# Patient Record
Sex: Male | Born: 1975
Health system: Southern US, Community
[De-identification: ages and names within clinical notes are randomized; demographics above are authoritative.]

## PROBLEM LIST (undated history)

## (undated) DIAGNOSIS — F411 Generalized anxiety disorder: Secondary | ICD-10-CM

## (undated) DIAGNOSIS — J45909 Unspecified asthma, uncomplicated: Secondary | ICD-10-CM

## (undated) DIAGNOSIS — D72819 Decreased white blood cell count, unspecified: Secondary | ICD-10-CM

## (undated) DIAGNOSIS — D696 Thrombocytopenia, unspecified: Secondary | ICD-10-CM

## (undated) HISTORY — DX: Generalized anxiety disorder: F41.1

## (undated) HISTORY — DX: Unspecified asthma, uncomplicated: J45.909

## (undated) HISTORY — PX: WISDOM TOOTH EXTRACTION: SHX21

## (undated) HISTORY — DX: Thrombocytopenia, unspecified: D69.6

## (undated) HISTORY — DX: Decreased white blood cell count, unspecified: D72.819

## (undated) HISTORY — PX: TONSILLECTOMY: SHX5217

---

## 2007-11-07 ENCOUNTER — Ambulatory Visit: Payer: Self-pay | Admitting: Internal Medicine

## 2007-12-19 ENCOUNTER — Ambulatory Visit: Payer: Self-pay | Admitting: Internal Medicine

## 2007-12-19 DIAGNOSIS — E785 Hyperlipidemia, unspecified: Secondary | ICD-10-CM | POA: Insufficient documentation

## 2007-12-19 LAB — CONVERTED CEMR LAB
AST: 23 units/L (ref 0–37)
Bilirubin, Direct: 0.2 mg/dL (ref 0.0–0.3)
CRP, High Sensitivity: <1 — ABNORMAL LOW (ref 0.00–5.00)
Chloride: 104 meq/L (ref 96–112)
Cholesterol: 144 mg/dL (ref 0–200)
Creatinine, Ser: 1 mg/dL (ref 0.4–1.5)
Eosinophils Absolute: 0.1 10*3/uL (ref 0.0–0.6)
Eosinophils Relative: 2.5 % (ref 0.0–5.0)
GFR calc non Af Amer: 93 mL/min
Glucose, Bld: 84 mg/dL (ref 70–99)
HCT: 45.7 % (ref 39.0–52.0)
Ketones, ur: NEGATIVE mg/dL
Leukocytes, UA: NEGATIVE
MCV: 90 fL (ref 78.0–100.0)
Neutrophils Relative %: 60.8 % (ref 43.0–77.0)
Nitrite: NEGATIVE
RBC: 5.08 M/uL (ref 4.22–5.81)
Sodium: 139 meq/L (ref 135–145)
Total Bilirubin: 1.4 mg/dL — ABNORMAL HIGH (ref 0.3–1.2)
Total Protein: 7.1 g/dL (ref 6.0–8.3)
Urobilinogen, UA: 0.2 (ref 0.0–1.0)
WBC: 4 10*3/uL — ABNORMAL LOW (ref 4.5–10.5)

## 2007-12-23 ENCOUNTER — Encounter: Payer: Self-pay | Admitting: Internal Medicine

## 2008-06-22 ENCOUNTER — Telehealth: Payer: Self-pay | Admitting: Internal Medicine

## 2008-07-01 ENCOUNTER — Encounter: Payer: Self-pay | Admitting: Internal Medicine

## 2008-12-08 ENCOUNTER — Ambulatory Visit: Payer: Self-pay | Admitting: Internal Medicine

## 2008-12-08 DIAGNOSIS — F411 Generalized anxiety disorder: Secondary | ICD-10-CM | POA: Insufficient documentation

## 2008-12-08 DIAGNOSIS — M76899 Other specified enthesopathies of unspecified lower limb, excluding foot: Secondary | ICD-10-CM | POA: Insufficient documentation

## 2008-12-15 ENCOUNTER — Ambulatory Visit: Payer: Self-pay | Admitting: Internal Medicine

## 2009-12-13 ENCOUNTER — Ambulatory Visit: Payer: Self-pay | Admitting: Internal Medicine

## 2009-12-13 LAB — CONVERTED CEMR LAB
CRP, High Sensitivity: 0.3
LDL Cholesterol: 93 mg/dL (ref 0–99)
VLDL: 9 mg/dL (ref 0–40)

## 2009-12-14 ENCOUNTER — Telehealth: Payer: Self-pay | Admitting: Internal Medicine

## 2010-01-14 ENCOUNTER — Ambulatory Visit: Payer: Self-pay | Admitting: Internal Medicine

## 2010-01-14 DIAGNOSIS — R040 Epistaxis: Secondary | ICD-10-CM | POA: Insufficient documentation

## 2010-01-17 ENCOUNTER — Telehealth: Payer: Self-pay | Admitting: Internal Medicine

## 2010-10-31 ENCOUNTER — Ambulatory Visit: Payer: Self-pay | Admitting: Internal Medicine

## 2010-10-31 DIAGNOSIS — J209 Acute bronchitis, unspecified: Secondary | ICD-10-CM | POA: Insufficient documentation

## 2010-10-31 DIAGNOSIS — J309 Allergic rhinitis, unspecified: Secondary | ICD-10-CM | POA: Insufficient documentation

## 2010-11-04 LAB — CONVERTED CEMR LAB: IgE (Immunoglobulin E), Serum: 231.1 intl units/mL — ABNORMAL HIGH (ref 0.0–180.0)

## 2010-11-11 ENCOUNTER — Ambulatory Visit: Payer: Self-pay | Admitting: Internal Medicine

## 2010-11-11 DIAGNOSIS — H6980 Other specified disorders of Eustachian tube, unspecified ear: Secondary | ICD-10-CM | POA: Insufficient documentation

## 2010-12-01 ENCOUNTER — Encounter: Payer: Self-pay | Admitting: Internal Medicine

## 2010-12-16 ENCOUNTER — Ambulatory Visit
Admission: RE | Admit: 2010-12-16 | Discharge: 2010-12-16 | Payer: Self-pay | Source: Home / Self Care | Attending: Internal Medicine | Admitting: Internal Medicine

## 2010-12-27 NOTE — Progress Notes (Signed)
Summary: Lab Results  Phone Note Outgoing Call   Summary of Call: call pt - HIV antibody test is negative.  cholesterol panel is good - I will send letter. Initial call taken by: D. Thomos Lemons DO,  December 14, 2009 8:52 AM  Follow-up for Phone Call        patient advised per Dr Artist Pais instructions  Follow-up by: Glendell Docker CMA,  December 14, 2009 9:18 AM

## 2010-12-27 NOTE — Assessment & Plan Note (Signed)
Summary: nose bleed- jr   Vital Signs:  Patient profile:   35 year old male Weight:      187.25 pounds BMI:     26.21 Temp:     98.1 degrees F oral Pulse rate:   88 / minute Pulse rhythm:   regular Resp:     16 per minute BP sitting:   120 / 80  (right arm) Cuff size:   regular  Vitals Entered By: Pearletha Furl CMA (January 14, 2010 2:33 PM) CC: nose bleed   Primary Care Provider:  Dondra Spry DO  CC:  nose bleed.  History of Present Illness: 35 y/o white male was sitting at desk when he blew his nose.  He notes significant  nose bleeding with for approx 20 mins.  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   Nose:  no active bleeding or clots and mucosal friability.   Lungs:  normal respiratory effort and normal breath sounds.   Heart:  normal rate, regular rhythm, and no gallop.     Impression & Recommendations:  Problem # 1:  NOSEBLEED (ICD-784.7) I suspect nasal steroids and use of fish oil contributed to nose bleeds.  No sign of active bleeding.  stop fish oil and nasal saline.  If recurrent symptoms, refer to ENT for packing or cauterization  Complete Medication List: 1)  Sertraline Hcl 25 Mg Tabs (Sertraline hcl) .... One by mouth qd 2)  Daily Vitamins Tabs (Multiple vitamin) .... Take 1 tablet by mouth once a day 3)  Fish Oil  1200 Mg Caps (Omega-3 fatty acids) .... Take 1 capsule by mouth two times a day  Patient Instructions: 1)  Stop fish oil caps 2)  Stop using nose sprays 3)  Call our office if your symptoms do not  improve or gets worse.    Current Allergies (reviewed today): No known allergies   Current Allergies (reviewed today): No known allergies

## 2010-12-27 NOTE — Progress Notes (Signed)
Summary: needs referral to ENT   Phone Note Call from Patient Call back at Home Phone [phone]   Caller: pt live Call For: Artist Pais  Summary of Call: Had another nose bleed on Sunday took 15 minutes to stop.  He needs referral to ENT.   Initial call taken by: Roselle Locus,  January 17, 2010 8:08 AM  Follow-up for Phone Call        pt. called back and is still waiting to hear back from Dr.Yanai Hobson wanting a referral. Call back [phone] Follow-up by: Michaelle Copas,  January 17, 2010 10:51 AM  Additional Follow-up for Phone Call Additional follow up Details #1::        Appt   [facility] ENT  DR BATES   March 1 Additional Follow-up by: Darral Dash,  January 18, 2010 9:54 AM

## 2010-12-27 NOTE — Assessment & Plan Note (Signed)
Summary: cpx 34yr f/u-ch   Vital Signs:  Patient profile:   35 year old male Height:      71 inches Weight:      185 pounds BMI:     25.90 O2 Sat:      100 % on Room air Temp:     97.8 degrees F oral Pulse rate:   70 / minute Pulse rhythm:   regular Resp:     16 per minute BP sitting:   110 / 80  (right arm) Cuff size:   regular  Vitals Entered By: Glendell Docker CMA (December 13, 2009 8:48 AM)  O2 Flow:  Room air  Contraindications/Deferment of Procedures/Staging:    Test/Procedure: FLU VAX    Reason for deferment: patient declined   Primary Care Provider:  Dondra Spry DO  CC:  CPX.  History of Present Illness: CPX  35 y/o for routine CPX.  no significant interval hx  anxiety - stable.  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   Head:  normocephalic and atraumatic.   Eyes:  vision grossly intact, pupils equal, pupils round, and pupils reactive to light.   Mouth:  pharyngeal erythema and postnasal drip.   Neck:  supple and no masses.  mild bilateral tenderness Lungs:  normal respiratory effort, normal breath sounds, and no wheezes.   Heart:  normal rate, regular rhythm, and no gallop.   Abdomen:  soft, non-tender, no hepatomegaly, and no splenomegaly.   Extremities:  No clubbing, cyanosis,  edema, or deformity noted with normal full range of motion of all joints.   Neurologic:  No cranial nerve deficits noted. Station and gait are normal.  Sensory, motor and coordinative functions appear intact. Psych:  normally interactive, good eye contact, not anxious appearing, and not depressed appearing.     Impression & Recommendations:  Problem # 1:  HEALTH MAINTENANCE EXAM (ICD-V70.0)  Reviewed adult health maintenance protocols.  Td Booster: Tdap (12/08/2008)   Flu Vax: Fluvax Non-MCR (12/13/2009)   Chol: 144 (12/19/2007)   HDL: 34.4 (12/19/2007)   LDL: 102 (12/19/2007)   TG: 38 (12/19/2007) TSH: 2.44 (12/19/2007)     Problem # 2:  GENERALIZED ANXIETY DISORDER (ICD-300.02) stable.  Maintain current medication regimen.  His updated medication list for this problem includes:    Sertraline Hcl 25 Mg Tabs (Sertraline hcl) ..... One by mouth qd  Complete Medication List: 1)  Sertraline Hcl 25 Mg Tabs (Sertraline hcl) .... One by mouth qd  Other Orders: T-Lipid Profile ([id number]) T- * Misc. Laboratory test [phone]) T-HIV Antibody  (Reflex) ([phone]) Influenza Vaccine NON MCR (13086) Admin 1st Vaccine (57846)  Patient Instructions: 1)  Please schedule a follow-up appointment in 1 year. 2)  Please schedule a follow-up appointment as needed. Prescriptions: SERTRALINE HCL 25 MG TABS (SERTRALINE HCL) one by mouth qd  #90 x 3  Entered and Authorized by:   D. Thomos Lemons DO   Signed by:   D. Thomos Lemons DO on 12/13/2009   Method used:   [facility] to        [facility] [phone]* (retail)       [address]       [place], [place]  [address]       Ph: [phone]       Fax: [phone]   RxID:   [id number]    Immunizations Administered:  Influenza Vaccine # 1:    Vaccine Type: Fluvax Non-MCR    Site: right deltoid    Mfr: GlaxoSmithKline    Dose: 0.5 ml    Route: IM    Given by: Glendell Docker CMA    Exp. Date: 05/26/2010     Lot #: [id number]    VIS given: 07/06/2009  Flu Vaccine Consent Questions:    Do you have a history of severe allergic reactions to this vaccine? no    Any prior history of allergic reactions to egg and/or gelatin? no    Do you have a sensitivity to the preservative Thimersol? no    Do you have a past history of Guillan-Barre Syndrome? no    Do you currently have an acute febrile illness? no    Have you ever had a severe reaction to latex? no    Vaccine information given and explained to patient? yes   Current Allergies (reviewed today): No known allergies

## 2010-12-29 NOTE — Consult Note (Signed)
Summary: [facility]  [facility]   Imported By: Lanelle Bal 12/20/2010 13:47:01  _____________________________________________________________________  External Attachment:    Type:   Image     Comment:   External Document

## 2010-12-29 NOTE — Assessment & Plan Note (Signed)
Summary: COUGH/HEA   Vital Signs:  Patient profile:   35 year old male Height:      71 inches Weight:      179.75 pounds BMI:     25.16 O2 Sat:      96 % on Room air Temp:     99.5 degrees F oral Pulse rate:   110 / minute Resp:     18 per minute BP sitting:   116 / 80  (right arm) Cuff size:   regular  Vitals Entered By: Glendell Docker CMA (October 31, 2010 10:16 AM)  O2 Flow:  Room air  Contraindications/Deferment of Procedures/Staging:    Test/Procedure: FLU VAX    Reason for deferment: patient declined  CC: Cough Is Patient Diabetic? No Pain Assessment Patient in pain? no        Primary Care Provider:  Dondra Spry DO  CC:  Cough.  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   Ears:  right TM slightly red and retracted Lungs:  normal respiratory effort and normal breath sounds.  slight coarse breath sounds bilaterally Heart:  normal rate, regular rhythm, and no gallop.     Impression & Recommendations:  Problem # 1:  ACUTE BRONCHITIS (ICD-466.0) pt with mild wheezing.  cough x 3 weeks.  pt treated with amox 875 mg two weeks ago tx with azithrom and dulera Patient advised to call office if symptoms persist or worsen.  His updated medication list for this problem includes:    Azithromycin 250 Mg Tabs  (Azithromycin) .Marland Kitchen... 2 tabs by mouth once daily x 3 days    Dulera 200-5 Mcg/act Aero (Mometasone furo-formoterol fum) .Marland Kitchen... 2 puffs two times a day  Problem # 2:  ALLERGIC RHINITIS (ICD-477.9) pt inquires whether underlying allergies increasing his risk for URIs check allergy panel  Orders: T- * Misc. Laboratory test [phone])  Complete Medication List: 1)  Sertraline Hcl 25 Mg Tabs (Sertraline hcl) .... One by mouth qd 2)  Daily Vitamins Tabs (Multiple vitamin) .... Take 1 tablet by mouth once a day 3)  Fish Oil 1200 Mg Caps (Omega-3 fatty acids) .... Take 1 capsule by mouth two times a day 4)  Azithromycin 250 Mg Tabs (Azithromycin) .... 2 tabs by mouth once daily x 3 days 5)  Dulera 200-5 Mcg/act Aero (Mometasone furo-formoterol fum) .... 2 puffs two times a day  Patient Instructions: 1)  Call our office if your symptoms do not  improve or gets worse. Prescriptions: AZITHROMYCIN 250 MG TABS (AZITHROMYCIN) 2 tabs by mouth once daily x 3 days  #6 x 0   Entered and Authorized by:   D. Thomos Lemons DO   Signed by:   D. Thomos Lemons DO on 10/31/2010   Method used:   Electronically to        [facility] [phone]* (retail)       [address]       [place], [place]  [address]       Ph: [phone]       Fax: [phone]   RxID:   [id number]    Orders Added: 1)  T- * Misc. Laboratory test [99999] 2)  Est. Patient Level III [99213]   Immunization History:  Influenza Immunization History:    Influenza:  declined (10/31/2010)   Immunization History:  Influenza Immunization History:    Influenza:  Declined (10/31/2010)  Current Allergies (reviewed today): No known allergies

## 2010-12-29 NOTE — Assessment & Plan Note (Signed)
Summary: ear pain dizzy cough/mhf   Vital Signs:  Patient profile:   35 year old male Height:      71 inches Weight:      179.25 pounds BMI:     25.09 O2 Sat:      100 % on Room air Temp:     98.3 degrees F oral Pulse rate:   72 / minute Resp:     16 per minute BP sitting:   110 / 80  (left arm) Cuff size:   large  Vitals Entered By: Glendell Docker CMA (November 11, 2010 11:38 AM)  O2 Flow:  Room air CC: cough Is Patient Diabetic? No Pain Assessment Patient in pain? no        Primary Care Provider:  Dondra Spry DO  CC:  cough.  History of Present Illness: c/o  non productive rattling cough, and bilateral ear congestion onset Monday, felt achey and had night chills, did not check temp, but felt feverish, tylenol taken with some relief  Preventive Screening-Counseling & Management  Alcohol-Tobacco     Smoking Status: never  Allergies (verified): No Known Drug Allergies  Past History:  Past Surgical History: Tonsillectomy        Family History: father with coronary artery disease status post 4 stents grandfather had type 2 diabetes and high cholesterol denies family history of colon cancer  Family History High cholesterol      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   Ears:  right TM retracted Mouth:  pharynx pink and moist.   Lungs:  normal respiratory effort and normal breath sounds.   Heart:  normal rate, regular rhythm, and no gallop.     Impression & Recommendations:  Problem # 1:  EUSTACHIAN TUBE DYSFUNCTION (ICD-381.81) Assessment Deteriorated use nasal sprays  as directed prev allergery screen revealed multple allergy triggers  Orders: Allergy Referral  (Allergy)  Complete Medication List: 1)  Sertraline Hcl 25 Mg Tabs (Sertraline hcl) .... One by mouth qd 2)   Daily Vitamins Tabs (Multiple vitamin) .... Take 1 tablet by mouth once a day 3)  Fish Oil 1200 Mg Caps (Omega-3 fatty acids) .... Take 1 capsule by mouth two times a day 4)  Nasonex 50 Mcg/act Susp (Mometasone furoate) .... 2 sprays each nostril once daily 5)  Astepro 0.15 % Soln (Azelastine hcl) .... 2 sprays each nostril two times a day  Patient Instructions: 1)  Our office will contact you re:  allergery referral 2)  Call our office if your ear symptoms do not  improve or gets worse. Prescriptions: ASTEPRO 0.15 % SOLN (AZELASTINE HCL) 2 sprays each nostril two times a day  #1 x 3   Entered and Authorized by:   D. Thomos Lemons DO   Signed by:   D. Thomos Lemons DO on 11/11/2010   Method used:   Electronically to        [facility] [phone]* (retail)       [address]       [place], [place]  [address]       Ph: [phone]       Fax: [phone]   RxID:   [id number] NASONEX 50 MCG/ACT SUSP (MOMETASONE FUROATE) 2 sprays each nostril once daily  #1 x  3   Entered and Authorized by:   D. Thomos Lemons DO   Signed by:   D. Thomos Lemons DO on 11/11/2010   Method used:   Electronically to        [facility] [phone]* (retail)       [address]       [place], [place]  [address]       Ph: [phone]       Fax: [phone]   RxID:   [id number]    Orders Added: 1)  Allergy Referral  [Allergy] 2)  Est. Patient Level III [24401]    Current Allergies (reviewed today): No known allergies

## 2011-01-04 NOTE — Assessment & Plan Note (Signed)
Summary: cpx/mhf   Vital Signs:  Patient profile:   35 year old male Height:      71 inches Weight:      182.50 pounds BMI:     25.55 O2 Sat:      98 % on Room air Temp:     97.8 degrees F oral Pulse rate:   74 / minute Resp:     16 per minute BP sitting:   100 / 60  (right arm) Cuff size:   large  Vitals Entered By: Glendell Docker CMA (December 16, 2010 8:31 AM)  O2 Flow:  Room air CC: CPX Is Patient Diabetic? No Pain Assessment Patient in pain? no      Comments non- fasting    Primary Care Provider:  Dondra Spry DO  CC:  CPX.  History of Present Illness: 35 y/o male for routine cpx  int med hx  seen by allergist mold allergy is most problematic singulair started using nose sprays regularly  Preventive Screening-Counseling & Management  Alcohol-Tobacco     Alcohol drinks/day: <1     Smoking Status: never  Caffeine-Diet-Exercise     Caffeine use/day: 2 beverages daily     Does Patient Exercise: no  Allergies (verified): No Known Drug Allergies  Past History:  Past Medical History: Childhood asthma Generalized anxiety disorder         Family History: father with coronary artery disease status post 4 stents grandfather had type 2 diabetes and high cholesterol denies family history of colon cancer  Family History High cholesterol      Social History: Caffeine use/day:  2 beverages daily Does Patient Exercise:  no  Review of Systems  The patient denies anorexia, fever, weight loss, weight gain, chest pain, syncope, dyspnea on exertion, abdominal pain, melena, hematochezia, severe indigestion/heartburn, depression, and testicular masses.    Physical Exam  General:  alert, well-developed, and well-nourished.   Head:  normocephalic and atraumatic.   Neck:  supple, full ROM, and no masses.   Lungs:  normal respiratory effort and normal breath sounds.   Heart:  normal rate, regular rhythm, and no gallop.   Abdomen:  soft, non-tender, normal  bowel sounds, no hepatomegaly, and no splenomegaly.   Genitalia:  no hydrocele, no varicocele, no scrotal masses, no testicular masses or atrophy, no cutaneous lesions, and no urethral discharge.   Msk:  mild crepitus right knee Extremities:  No lower extremity edema Neurologic:  cranial nerves II-XII intact and gait normal.     Impression & Recommendations:  Problem # 1:  HEALTH MAINTENANCE EXAM (ICD-V70.0) Reviewed adult health maintenance protocols.  Td Booster: Tdap (12/08/2008)   Flu Vax: Declined (10/31/2010)   Chol: 142 (12/13/2009)   HDL: 40 (12/13/2009)   LDL: 93 (12/13/2009)   TG: 47 (12/13/2009) TSH: 2.44 (12/19/2007)     Complete Medication List: 1)  Sertraline Hcl 25 Mg Tabs (Sertraline hcl) .... One by mouth qd 2)  Daily Vitamins Tabs (Multiple vitamin) .... Take 1 tablet by mouth once a day 3)  Fish Oil 1200 Mg Caps (Omega-3 fatty acids) .... Take 1 capsule by mouth two times a day 4)  Nasonex 50 Mcg/act Susp (Mometasone furoate) .... 2 sprays each nostril once daily 5)  Astepro 0.15 % Soln (Azelastine hcl) .... 2 sprays each nostril two times a day 6)  Cetirizine Hcl 10 Mg Tabs (Cetirizine hcl) .... Take 1 tablet by mouth once a day  Patient Instructions: 1)  Please schedule a follow-up appointment  in 1 year. 2)  Please schedule a follow-up appointment as needed. Prescriptions: SERTRALINE HCL 25 MG TABS (SERTRALINE HCL) one by mouth qd  #90 x 3   Entered and Authorized by:   D. Thomos Lemons DO   Signed by:   D. Thomos Lemons DO on 12/16/2010   Method used:   Electronically to        [name] [phone]* (retail)       [address]       [place], [place]  [address]       Ph: [phone]       Fax: [phone]   RxID:   [id number]    Orders Added: 1)  Est. Patient 18-39 years [24401]     Current Allergies (reviewed today): No known allergies

## 2011-01-10 ENCOUNTER — Encounter: Payer: Self-pay | Admitting: Internal Medicine

## 2011-02-02 NOTE — Letter (Signed)
Summary: [facility]  [facility]   Imported By: Maryln Gottron 01/25/2011 14:53:48  _____________________________________________________________________  External Attachment:    Type:   Image     Comment:   External Document

## 2011-02-24 ENCOUNTER — Telehealth: Payer: Self-pay | Admitting: Internal Medicine

## 2011-02-24 NOTE — Telephone Encounter (Signed)
Call placed to pharmacist, verified rx on file from January 2012

## 2011-02-24 NOTE — Telephone Encounter (Signed)
Refill- sertraline hcl 25mg  tablet. Take 1 tablet by mouth every day. Qty 90. Last fill 12.20.11

## 2011-09-21 ENCOUNTER — Encounter: Payer: Self-pay | Admitting: Internal Medicine

## 2011-09-21 ENCOUNTER — Ambulatory Visit (INDEPENDENT_AMBULATORY_CARE_PROVIDER_SITE_OTHER): Payer: Managed Care, Other (non HMO) | Admitting: Internal Medicine

## 2011-09-21 DIAGNOSIS — J40 Bronchitis, not specified as acute or chronic: Secondary | ICD-10-CM

## 2011-09-21 MED ORDER — DOXYCYCLINE HYCLATE 100 MG PO TABS: 100.0000 mg | ORAL_TABLET | Freq: Two times a day (BID) | ORAL | Status: DC

## 2011-09-21 NOTE — Progress Notes (Signed)
  Subjective:    Patient ID: Larry Brooks, male    DOB: November 23, 1976, 35 y.o.   MRN: [id number]  HPI Pt presents to clinic for evaluation of cough. Notes 5-6 d h/o cough productive for green sputum without hemoptysis, dyspnea or wheezing. No f/c. +sick exp. No alleviating or exacerbating factors. No other complaints.  Past Medical History  Diagnosis Date  . Childhood asthma   . Generalized anxiety disorder    Past Surgical History  Procedure Date  . Tonsillectomy     reports that he has never smoked. He has never used smokeless tobacco. He reports that he drinks alcohol. He reports that he does not use illicit drugs. family history includes Coronary artery disease in his father; Diabetes type II in an unspecified family member; and Hyperlipidemia in an unspecified family member.  There is no history of Colon cancer. No Known Allergies     Review of Systems see hpi     Objective:   Physical Exam  Nursing note and vitals reviewed. Constitutional: He appears well-developed and well-nourished. No distress.  HENT:  Head: Normocephalic and atraumatic.  Right Ear: Tympanic membrane, external ear and ear canal normal.  Left Ear: Tympanic membrane, external ear and ear canal normal.  Nose: Nose normal.  Mouth/Throat: Oropharynx is clear and moist. No oropharyngeal exudate.  Eyes: Conjunctivae are normal. No scleral icterus.  Neck: Neck supple.  Cardiovascular: Normal rate, regular rhythm and normal heart sounds.   Pulmonary/Chest: Effort normal and breath sounds normal. No respiratory distress. He has no wheezes. He has no rales.  Lymphadenopathy:    He has no cervical adenopathy.  Neurological: He is alert.  Skin: He is not diaphoretic.  Psychiatric: He has a normal mood and affect.          Assessment & Plan:

## 2011-09-24 ENCOUNTER — Encounter: Payer: Self-pay | Admitting: Internal Medicine

## 2011-09-24 DIAGNOSIS — J40 Bronchitis, not specified as acute or chronic: Secondary | ICD-10-CM | POA: Insufficient documentation

## 2011-09-24 NOTE — Assessment & Plan Note (Signed)
beegin doxycycline. otc sx relief prn. Followup if no improvement or worsening.

## 2011-09-28 ENCOUNTER — Ambulatory Visit (INDEPENDENT_AMBULATORY_CARE_PROVIDER_SITE_OTHER): Payer: Managed Care, Other (non HMO) | Admitting: Internal Medicine

## 2011-09-28 ENCOUNTER — Encounter: Payer: Self-pay | Admitting: Internal Medicine

## 2011-09-28 DIAGNOSIS — J209 Acute bronchitis, unspecified: Secondary | ICD-10-CM

## 2011-09-28 MED ORDER — LEVOFLOXACIN 500 MG PO TABS: 500.0000 mg | ORAL_TABLET | Freq: Every day | ORAL | Status: AC

## 2011-09-28 MED ORDER — ALBUTEROL 90 MCG/ACT IN AERS: 2.0000 | INHALATION_SPRAY | Freq: Four times a day (QID) | RESPIRATORY_TRACT | Status: DC | PRN

## 2011-10-08 NOTE — Assessment & Plan Note (Signed)
Change doxycycline to levaquin. Begin albuterol mdi prn. Pt defers cxr. Followup if no improvement or worsening.

## 2011-10-08 NOTE — Progress Notes (Signed)
  Subjective:    Patient ID: Larry Brooks, male    DOB: May 11, 1976, 35 y.o.   MRN: [id number]  HPI  Pt presents to clinic for evaluation of cough. Has one day of doxycycline remaining for uri. Congestion improved but has productive cough without hemoptysis as well as intermittent wheezing without dyspnea. No f/c. No other alleviating or exacerbating factors. No other complaints.  Past Medical History  Diagnosis Date  . Childhood asthma   . Generalized anxiety disorder    Past Surgical History  Procedure Date  . Tonsillectomy     reports that he has never smoked. He has never used smokeless tobacco. He reports that he drinks alcohol. He reports that he does not use illicit drugs. family history includes Coronary artery disease in his father; Diabetes type II in an unspecified family member; and Hyperlipidemia in an unspecified family member.  There is no history of Colon cancer. No Known Allergies     Review of Systems see hpi     Objective:   Physical Exam  Nursing note and vitals reviewed. Constitutional: He appears well-developed and well-nourished. No distress.  HENT:  Head: Normocephalic and atraumatic.  Right Ear: External ear normal.  Left Ear: External ear normal.  Nose: Nose normal.  Mouth/Throat: Oropharynx is clear and moist. No oropharyngeal exudate.  Eyes: Conjunctivae are normal. No scleral icterus.  Neck: Neck supple.  Cardiovascular: Normal rate, regular rhythm and normal heart sounds.  Exam reveals no gallop and no friction rub.   No murmur heard. Pulmonary/Chest: Effort normal and breath sounds normal. No respiratory distress. He has no wheezes. He has no rales.  Neurological: He is alert.  Skin: Skin is warm and dry. He is not diaphoretic.  Psychiatric: He has a normal mood and affect.          Assessment & Plan:

## 2011-10-13 ENCOUNTER — Ambulatory Visit (INDEPENDENT_AMBULATORY_CARE_PROVIDER_SITE_OTHER): Payer: Managed Care, Other (non HMO) | Admitting: Internal Medicine

## 2011-10-13 ENCOUNTER — Encounter: Payer: Self-pay | Admitting: Internal Medicine

## 2011-10-13 VITALS — BP 100/80 | HR 75 | Temp 98.1°F | Resp 18 | Wt 175.0 lb

## 2011-10-13 DIAGNOSIS — R05 Cough: Secondary | ICD-10-CM

## 2011-10-13 DIAGNOSIS — R059 Cough, unspecified: Secondary | ICD-10-CM

## 2011-10-13 MED ORDER — FLUTICASONE-SALMETEROL 115-21 MCG/ACT IN AERO: 2.0000 | INHALATION_SPRAY | Freq: Two times a day (BID) | RESPIRATORY_TRACT | Status: DC

## 2011-10-13 NOTE — Assessment & Plan Note (Signed)
Persistent despite two abx courses. Recent cxr reportedly without infiltrate. Suspect post-bronchitis cough. Attempt advair 115 2 puffs bid sample. Followup if no improvement or worsening.

## 2011-10-13 NOTE — Progress Notes (Signed)
  Subjective:    Patient ID: Larry Brooks, male    DOB: [birth year], 35 y.o.   MRN: [id number]  HPI Pt presents to clinic for re-evaluation of cough. S/p abx course x 2 (doxy and levaquin) for bronchitis. Last visit cxr discussed and deferred. Since last visit cough persisted and was seen at outside UC with reportedly nl cxr and given prednisone taper currently finishing. NP cough persists and wheezing resolved with steroid. Denies f/c, prod cough, hemoptysis. With exception of cough does not feel sick. No other alleviating or exacerbating factors. No other complaints.  Past Medical History  Diagnosis Date  . Childhood asthma   . Generalized anxiety disorder    Past Surgical History  Procedure Date  . Tonsillectomy     reports that he has never smoked. He has never used smokeless tobacco. He reports that he drinks alcohol. He reports that he does not use illicit drugs. family history includes Coronary artery disease in his father; Diabetes type II in an unspecified family member; and Hyperlipidemia in an unspecified family member.  There is no history of Colon cancer. No Known Allergies     Review of Systems see hpi     Objective:   Physical Exam  Nursing note and vitals reviewed. Constitutional: He appears well-developed and well-nourished. No distress.  HENT:  Head: Normocephalic and atraumatic.  Right Ear: External ear normal.  Left Ear: External ear normal.  Nose: Nose normal.  Mouth/Throat: Oropharynx is clear and moist. No oropharyngeal exudate.  Eyes: Conjunctivae are normal. Right eye exhibits no discharge. Left eye exhibits no discharge. No scleral icterus.  Pulmonary/Chest: Effort normal and breath sounds normal. No respiratory distress. He has no wheezes. He has no rales.  Neurological: He is alert.  Skin: Skin is warm and dry. He is not diaphoretic.  Psychiatric: He has a normal mood and affect.          Assessment & Plan:

## 2011-10-26 ENCOUNTER — Other Ambulatory Visit: Payer: Self-pay | Admitting: Internal Medicine

## 2011-10-26 ENCOUNTER — Telehealth: Payer: Self-pay | Admitting: *Deleted

## 2011-10-26 DIAGNOSIS — R053 Chronic cough: Secondary | ICD-10-CM

## 2011-10-26 DIAGNOSIS — R05 Cough: Secondary | ICD-10-CM

## 2011-10-26 MED ORDER — LEVOFLOXACIN 500 MG PO TABS: 500.0000 mg | ORAL_TABLET | Freq: Every day | ORAL | Status: AC

## 2011-10-26 NOTE — Telephone Encounter (Signed)
Patient called and left voice message that he is now hoarse, and continues tohave nasal drainage green in color. He also states that he can barley talk. He would like to know what Dr Rodena Medin advises, or if he needs to return for evaluation.

## 2011-10-26 NOTE — Telephone Encounter (Signed)
Pulmonary consult was discussed if cough would not resolve (referral order placed.) given green nasal drainage would recommend repeat levaquin 500mg  po qd x10 course to address possible sinusitis while awaiting pulmonary consult

## 2011-10-26 NOTE — Telephone Encounter (Signed)
Call returned to patient at [phone], he was informed per Dr Rodena Medin instructions. He was informed of referral to Pulmonary as well. Patient has not additional concerns at this time. Rx sent to pharmacy.

## 2011-10-31 ENCOUNTER — Encounter: Payer: Self-pay | Admitting: Pulmonary Disease

## 2011-10-31 ENCOUNTER — Ambulatory Visit (INDEPENDENT_AMBULATORY_CARE_PROVIDER_SITE_OTHER): Payer: Managed Care, Other (non HMO) | Admitting: Pulmonary Disease

## 2011-10-31 VITALS — BP 120/82 | HR 74 | Temp 97.9°F | Ht 71.0 in | Wt 173.0 lb

## 2011-10-31 DIAGNOSIS — R059 Cough, unspecified: Secondary | ICD-10-CM

## 2011-10-31 DIAGNOSIS — R05 Cough: Secondary | ICD-10-CM

## 2011-10-31 MED ORDER — FLUTICASONE-SALMETEROL 115-21 MCG/ACT IN AERO: 2.0000 | INHALATION_SPRAY | Freq: Two times a day (BID) | RESPIRATORY_TRACT | Status: DC

## 2011-10-31 NOTE — Progress Notes (Signed)
  Subjective:    Patient ID: Larry Brooks, male    DOB: [birth year], 35 y.o.   MRN: [id number]  HPI 35 y.o never smoker for evaluation of sub acute cough x 5 weeks He has h/o childhood asthma , last symptomatic at age 49 , 'grew out of it'. He developed URI symptoms + productive green phlegm in last week October , around the same time his kids were sick, Initially given doxy, then levaquin & albuterol for wheezing then put on advair MDI 115 for persistent cough. [facility]R was done at [facility] - negative per pt. His main complaint is cough & occasional wheezing as the advair wears off in 12 h. He is compliant with a regimen of advair, zyrtec & is hardly needing rescue inhaler. No hemoptysis or post nasal drip or heartburn. Reviewed home & work environment - no obvious triggers.   Review of Systems  Constitutional: Negative for fever and unexpected weight change.  HENT: Positive for ear pain, congestion, sore throat, postnasal drip and sinus pressure. Negative for nosebleeds, rhinorrhea, sneezing, trouble swallowing and dental problem.   Eyes: Negative for redness and itching.  Respiratory: Positive for cough, shortness of breath and wheezing. Negative for chest tightness.   Cardiovascular: Negative for palpitations and leg swelling.  Gastrointestinal: Negative for nausea and vomiting.  Genitourinary: Negative for dysuria.  Musculoskeletal: Negative for joint swelling.  Skin: Negative for rash.  Neurological: Positive for headaches.  Hematological: Does not bruise/bleed easily.  Psychiatric/Behavioral: Negative for dysphoric mood. The patient is not nervous/anxious.        Objective:   Physical Exam  Gen. Pleasant, well-nourished, in no distress, normal affect ENT - no lesions, no post nasal drip or enlarged tonsils Neck: No JVD, no thyromegaly, no carotid bruits Lungs: no use of accessory muscles, no dullness to percussion, clear without rales or rhonchi  Cardiovascular: Rhythm regular,  heart sounds  normal, no murmurs or gallops, no peripheral edema Abdomen: soft and non-tender, no hepatosplenomegaly, BS normal. Musculoskeletal: No deformities, no cyanosis or clubbing Neuro:  alert, non focal       Assessment & Plan:

## 2011-10-31 NOTE — Patient Instructions (Signed)
You have post bronchitic cough Stay on adavir twice daily - call me if hoarseness comes back Use rescue inhaler as needed 2 puffs Use ZYRTEC-D daily (has decongestant) OK to use DELSYM cough 2 tbsp syrup 2-3 times daily for cough as needed Obtain CXR report from [facility] Call if no better by end Dec

## 2011-11-01 ENCOUNTER — Telehealth: Payer: Self-pay | Admitting: Pulmonary Disease

## 2011-11-01 NOTE — Telephone Encounter (Signed)
Received copies from [facility] ,on 12.5.12. Forwarded  11 pages to Dr. Vassie Loll ,for review.  sj

## 2011-11-01 NOTE — Assessment & Plan Note (Signed)
Favor post bronchitic cough - no obvious perpetuating triggers such as PND, allergies or GERD Stay on adavir twice daily - call  if hoarseness comes back Use rescue inhaler as needed 2 puffs Use ZYRTEC-D daily (has decongestant) OK to use DELSYM cough 2 tbsp syrup 2-3 times daily for cough as needed Obtain CXR report from [facility] Call if no better by end Dec - post bronchitic cough can take upto 8-10 wks to resolve sometimes. If worse , can trial empiric steroids or sequentially treat for PND & GERD

## 2011-12-11 ENCOUNTER — Encounter: Payer: Managed Care, Other (non HMO) | Admitting: Internal Medicine

## 2011-12-14 ENCOUNTER — Encounter: Payer: Self-pay | Admitting: Internal Medicine

## 2011-12-14 ENCOUNTER — Ambulatory Visit (INDEPENDENT_AMBULATORY_CARE_PROVIDER_SITE_OTHER): Payer: Managed Care, Other (non HMO) | Admitting: Internal Medicine

## 2011-12-14 VITALS — BP 110/80 | HR 78 | Temp 97.9°F | Resp 18 | Ht 71.0 in | Wt 163.0 lb

## 2011-12-14 DIAGNOSIS — Z0000 Encounter for general adult medical examination without abnormal findings: Secondary | ICD-10-CM

## 2011-12-14 LAB — BASIC METABOLIC PANEL
CO2: 27 mEq/L (ref 19–32)
Calcium: 9.3 mg/dL (ref 8.4–10.5)
Chloride: 103 mEq/L (ref 96–112)
Creat: 1.17 mg/dL (ref 0.50–1.35)
Glucose, Bld: 77 mg/dL (ref 70–99)
Sodium: 139 mEq/L (ref 135–145)

## 2011-12-14 LAB — LIPID PANEL
HDL: 30 mg/dL — ABNORMAL LOW (ref >39)
LDL Cholesterol: 77 mg/dL (ref 0–99)
Total CHOL/HDL Ratio: 4.1 Ratio

## 2011-12-14 LAB — CBC WITH DIFFERENTIAL/PLATELET
Basophils Relative: 0 % (ref 0–1)
Eosinophils Absolute: 0.1 10*3/uL (ref 0.0–0.7)
Eosinophils Relative: 4 % (ref 0–5)
Lymphs Abs: 1.2 10*3/uL (ref 0.7–4.0)
MCH: 31.4 pg (ref 26.0–34.0)
MCHC: 35.4 g/dL (ref 30.0–36.0)
MCV: 88.8 fL (ref 78.0–100.0)
Platelets: 154 10*3/uL (ref 150–400)
RBC: 5.25 MIL/uL (ref 4.22–5.81)
RDW: 13.9 % (ref 11.5–15.5)

## 2011-12-14 LAB — HEPATIC FUNCTION PANEL
Albumin: 4.8 g/dL (ref 3.5–5.2)
Bilirubin, Direct: 0.3 mg/dL (ref 0.0–0.3)
Total Bilirubin: 1.3 mg/dL — ABNORMAL HIGH (ref 0.3–1.2)

## 2011-12-14 LAB — TSH: TSH: 1.958 u[IU]/mL (ref 0.350–4.500)

## 2011-12-14 NOTE — Patient Instructions (Signed)
Please schedule fasting physical labs before next year's physical Cbc, chem7, lipid, lft, tsh, ua v70.0

## 2011-12-15 LAB — URINALYSIS, ROUTINE W REFLEX MICROSCOPIC
Leukocytes, UA: NEGATIVE
Nitrite: NEGATIVE
Specific Gravity, Urine: 1.023 (ref 1.005–1.030)
Urobilinogen, UA: 0.2 mg/dL (ref 0.0–1.0)
pH: 7.5 (ref 5.0–8.0)

## 2011-12-16 DIAGNOSIS — Z7189 Other specified counseling: Secondary | ICD-10-CM | POA: Insufficient documentation

## 2011-12-16 NOTE — Progress Notes (Signed)
  Subjective:    Patient ID: Larry Brooks, male    DOB: 08/06/1976, 36 y.o.   MRN: [id number]  HPI Pt presents to clinic for annual exam. Has post bronchitic cough s/p pulmonary consult. Cough is improving with advair. Is rinsing mouth after use and denies thrush. No other complaints.  Past Medical History  Diagnosis Date  . Childhood asthma   . Generalized anxiety disorder    Past Surgical History  Procedure Date  . Tonsillectomy   . Wisdom tooth extraction     reports that he has never smoked. He has never used smokeless tobacco. He reports that he drinks alcohol. He reports that he does not use illicit drugs. family history includes Coronary artery disease in his father; Diabetes type II in an unspecified family member; and Hyperlipidemia in an unspecified family member.  There is no history of Colon cancer. No Known Allergies   Review of Systems see hpi     Objective:   Physical Exam  Physical Exam  Nursing note and vitals reviewed. Constitutional: He appears well-developed and well-nourished. No distress.  HENT:  Head: Normocephalic and atraumatic.  Right Ear: Tympanic membrane and external ear normal.  Left Ear: Tympanic membrane and external ear normal.  Nose: Nose normal.  Mouth/Throat: Uvula is midline, oropharynx is clear and moist and mucous membranes are normal. No oropharyngeal exudate.  Eyes: Conjunctivae and EOM are normal. Pupils are equal, round, and reactive to light. Right eye exhibits no discharge. Left eye exhibits no discharge. No scleral icterus.  Neck: Neck supple. Carotid bruit is not present. No thyromegaly present.  Cardiovascular: Normal rate, regular rhythm and normal heart sounds.  Exam reveals no gallop and no friction rub.   No murmur heard. Pulmonary/Chest: Effort normal and breath sounds normal. No respiratory distress. He has no wheezes. He has no rales.  Abdominal: Soft. He exhibits no distension and no mass. There is no  hepatosplenomegaly. There is no tenderness. There is no rebound. Hernia confirmed negative in the right inguinal area and confirmed negative in the left inguinal area.   Lymphadenopathy:       Right: No inguinal adenopathy present.       Left: No inguinal adenopathy present.  Neurological: He is alert.  Skin: Skin is warm and dry. He is not diaphoretic.  Psychiatric: He has a normal mood and affect.        Assessment & Plan:

## 2011-12-16 NOTE — Assessment & Plan Note (Signed)
Nl exam. Obtain screening labs. 

## 2012-12-16 ENCOUNTER — Encounter: Payer: Managed Care, Other (non HMO) | Admitting: Internal Medicine

## 2012-12-27 ENCOUNTER — Ambulatory Visit (INDEPENDENT_AMBULATORY_CARE_PROVIDER_SITE_OTHER): Payer: Managed Care, Other (non HMO) | Admitting: Family

## 2012-12-27 ENCOUNTER — Encounter: Payer: Self-pay | Admitting: Family

## 2012-12-27 VITALS — BP 120/70 | HR 78 | Temp 97.8°F | Resp 16 | Ht 71.5 in | Wt 156.0 lb

## 2012-12-27 DIAGNOSIS — F411 Generalized anxiety disorder: Secondary | ICD-10-CM

## 2012-12-27 DIAGNOSIS — Z0000 Encounter for general adult medical examination without abnormal findings: Secondary | ICD-10-CM

## 2012-12-27 LAB — LIPID PANEL
Cholesterol: 104 mg/dL (ref 0–200)
LDL Cholesterol: 63 mg/dL (ref 0–99)
Triglycerides: 51 mg/dL (ref <150)

## 2012-12-27 LAB — CBC WITH DIFFERENTIAL/PLATELET
Eosinophils Absolute: 0.1 10*3/uL (ref 0.0–0.7)
HCT: 43 % (ref 39.0–52.0)
Hemoglobin: 15.5 g/dL (ref 13.0–17.0)
Lymphs Abs: 1 10*3/uL (ref 0.7–4.0)
MCH: 31.6 pg (ref 26.0–34.0)
Monocytes Absolute: 0.3 10*3/uL (ref 0.1–1.0)
Monocytes Relative: 9 % (ref 3–12)
Neutrophils Relative %: 57 % (ref 43–77)
RBC: 4.91 MIL/uL (ref 4.22–5.81)

## 2012-12-27 LAB — BASIC METABOLIC PANEL WITH GFR
BUN: 17 mg/dL (ref 6–23)
Chloride: 105 mEq/L (ref 96–112)
GFR, Est African American: >89 mL/min
GFR, Est Non African American: >89 mL/min
Potassium: 4.3 mEq/L (ref 3.5–5.3)
Sodium: 140 mEq/L (ref 135–145)

## 2012-12-27 NOTE — Assessment & Plan Note (Signed)
Encouraged pt to continue healthy diet and get regular exercise. Declines flu shot

## 2012-12-27 NOTE — Assessment & Plan Note (Signed)
Well controlled off of meds.  Monitor.  

## 2012-12-27 NOTE — Patient Instructions (Addendum)
Please complete your lab work prior to leaving.  Follow up in 1 year- sooner if problems or concerns.

## 2012-12-27 NOTE — Progress Notes (Signed)
Subjective:    Patient ID: Larry Brooks, male    DOB: 06/09/1976, 37 y.o.   MRN: [id number]  HPI  Patient presents today for complete physical.  Immunizations: Declines flu shot.  Tetanus up to date. Diet: Vegan diet. Exercise: Reports that he does not exercise regularly  Reports that stopped zoloft.  Had a lot of stress in his life at one point.  Now things are better. He tapered over a period of 8 weeks and now feels well off of meds.   Review of Systems  Constitutional: Negative for unexpected weight change.  HENT: Negative for hearing loss and congestion.   Eyes: Negative for visual disturbance.  Respiratory: Negative for cough and shortness of breath.   Cardiovascular: Negative for chest pain.  Gastrointestinal: Negative for vomiting, diarrhea and constipation.  Genitourinary: Negative for dysuria, urgency and frequency.  Musculoskeletal: Negative for myalgias and arthralgias.  Skin: Negative for rash.  Neurological: Negative for headaches.  Hematological: Negative for adenopathy.  Psychiatric/Behavioral:       Denies current depression or anxiety   Past Medical History  Diagnosis Date  . Childhood asthma   . Generalized anxiety disorder     History   Social History  . Marital Status: Married    Spouse Name: N/A    Number of Children: N/A  . Years of Education: N/A   Occupational History  . Not on file.   Social History Main Topics  . Smoking status: Never Smoker   . Smokeless tobacco: Never Used  . Alcohol Use: Yes     Comment: very rare-social  . Drug Use: No  . Sexually Active: Not on file   Other Topics Concern  . Not on file   Social History Narrative   [employer] -[employer]  2 sons    Past Surgical History  Procedure Date  . Tonsillectomy   . Wisdom tooth extraction     Family History  Problem Relation Age of Onset  . Coronary artery disease Father     status post 4 stents   . Cancer Father     prostate  .  Diabetes type II      grandfather  . Hyperlipidemia      grandfather  . Colon cancer Neg Hx     No Known Allergies  Current Outpatient Prescriptions on File Prior to Visit  Medication Sig Dispense Refill  . albuterol (PROVENTIL,VENTOLIN) 90 MCG/ACT inhaler Inhale 2 puffs into the lungs every 6 (six) hours as needed for wheezing.  17 g  4    BP 120/70  Pulse 78  Temp 97.8 F (36.6 C) (Oral)  Resp 16  Ht 5' 11.5" (1.816 m)  Wt 156 lb (70.761 kg)  BMI 21.45 kg/m2  SpO2 99%       Objective:   Physical Exam  Physical Exam  Constitutional: He is oriented to person, place, and time. He appears well-developed and well-nourished. No distress.  HENT:  Head: Normocephalic and atraumatic.  Right Ear: Tympanic membrane and ear canal normal.  Left Ear: Tympanic membrane and ear canal normal.  Mouth/Throat: Oropharynx is clear and moist.  Eyes: Pupils are equal, round, and reactive to light. No scleral icterus.  Neck: Normal range of motion. No thyromegaly present.  Cardiovascular: Normal rate and regular rhythm.   No murmur heard. Pulmonary/Chest: Effort normal and breath sounds normal. No respiratory distress. He has no wheezes. He has no rales. He exhibits no tenderness.  Abdominal: Soft. Bowel sounds are normal. He  exhibits no distension and no mass. There is no tenderness. There is no rebound and no guarding.  Musculoskeletal: He exhibits no edema.  Lymphadenopathy:    He has no cervical adenopathy.  Neurological: He is alert and oriented to person, place, and time. 3+ patellar reflexes bilaterally. He exhibits normal muscle tone. Coordination normal.  Skin: Skin is warm and dry.  Psychiatric: He has a normal mood and affect. His behavior is normal. Judgment and thought content normal.          Assessment & Plan:         Assessment & Plan:

## 2012-12-28 LAB — URINALYSIS, ROUTINE W REFLEX MICROSCOPIC
Glucose, UA: NEGATIVE mg/dL
Hgb urine dipstick: NEGATIVE
Leukocytes, UA: NEGATIVE
pH: 6 (ref 5.0–8.0)

## 2012-12-29 LAB — HEPATITIS B SURFACE ANTIBODY,QUALITATIVE: Hep B S Ab: NONREACTIVE

## 2013-01-01 ENCOUNTER — Encounter: Payer: Self-pay | Admitting: Family

## 2013-03-26 ENCOUNTER — Encounter: Payer: Self-pay | Admitting: Family

## 2013-03-26 ENCOUNTER — Ambulatory Visit (INDEPENDENT_AMBULATORY_CARE_PROVIDER_SITE_OTHER): Payer: Managed Care, Other (non HMO) | Admitting: Family

## 2013-03-26 VITALS — BP 110/80 | HR 80 | Temp 98.2°F | Resp 16 | Wt 153.0 lb

## 2013-03-26 DIAGNOSIS — J309 Allergic rhinitis, unspecified: Secondary | ICD-10-CM

## 2013-03-26 MED ORDER — FLUTICASONE PROPIONATE 50 MCG/ACT NA SUSP: 2.0000 | Freq: Every day | NASAL | Status: DC

## 2013-03-26 MED ORDER — AMOXICILLIN-POT CLAVULANATE 875-125 MG PO TABS: 1.0000 | ORAL_TABLET | Freq: Two times a day (BID) | ORAL | Status: DC

## 2013-03-26 NOTE — Assessment & Plan Note (Signed)
Suspect cough due to AR.  Recommended that he add zyrtec D + flonase.  If nasal drainage worsens or has not improved in 2-3 days, I have instructed him to start augmentin.  He verbalizes understanding.

## 2013-03-26 NOTE — Patient Instructions (Addendum)
Please start augmentin if symptoms are not improved in 2-3 days with use of flonase and zyrtec D. Call if symptoms are not resolved in 1 week.

## 2013-03-26 NOTE — Progress Notes (Signed)
  Subjective:    Patient ID: Larry Brooks, male    DOB: 1976/02/14, 37 y.o.   MRN: [id number]  HPI  Mr Moorman is a 37 yr old male who presents today with chief complaint of nasal congestion.  Reports that nasal congestion has been present since 4/25.  He has been using nasal wash, delsym and zyrtec.  Nasal congestion is associated with a tight cough.   Nasal drainage is green.  He report + associated chills at night.  Reports mild sinus pressure.  Feels "50% better today than yesterday."s    Review of Systems See HPI  Past Medical History  Diagnosis Date  . Childhood asthma   . Generalized anxiety disorder     History   Social History  . Marital Status: Married    Spouse Name: N/A    Number of Children: N/A  . Years of Education: N/A   Occupational History  . Not on file.   Social History Main Topics  . Smoking status: Never Smoker   . Smokeless tobacco: Never Used  . Alcohol Use: Yes     Comment: very rare-social  . Drug Use: No  . Sexually Active: Not on file   Other Topics Concern  . Not on file   Social History Narrative   [employer] -[employer]   Married  2 sons    Past Surgical History  Procedure Laterality Date  . Tonsillectomy    . Wisdom tooth extraction      Family History  Problem Relation Age of Onset  . Coronary artery disease Father     status post 4 stents   . Cancer Father     prostate  . Diabetes type II      grandfather  . Hyperlipidemia      grandfather  . Colon cancer Neg Hx     No Known Allergies  Current Outpatient Prescriptions on File Prior to Visit  Medication Sig Dispense Refill  . albuterol (PROVENTIL,VENTOLIN) 90 MCG/ACT inhaler Inhale 2 puffs into the lungs every 6 (six) hours as needed for wheezing.  17 g  4   No current facility-administered medications on file prior to visit.    BP 110/80  Pulse 80  Temp(Src) 98.2 F (36.8 C) (Oral)  Resp 16  Wt 153 lb (69.4 kg)  BMI 21.04 kg/m2  SpO2  99%       Objective:   Physical Exam  Constitutional: He is oriented to person, place, and time. He appears well-developed and well-nourished. No distress.  HENT:  Head: Normocephalic and atraumatic.  Right Ear: Tympanic membrane and ear canal normal.  Left Ear: Tympanic membrane and ear canal normal.  Mild oropharyngeal erythema without edema or exudates  Cardiovascular: Normal rate and regular rhythm.   No murmur heard. Pulmonary/Chest: Effort normal and breath sounds normal. No respiratory distress. He has no wheezes. He has no rales. He exhibits no tenderness.  Musculoskeletal: He exhibits no edema.  Neurological: He is alert and oriented to person, place, and time.  Psychiatric: He has a normal mood and affect. His behavior is normal. Judgment and thought content normal.          Assessment & Plan:

## 2013-12-29 ENCOUNTER — Encounter: Payer: Managed Care, Other (non HMO) | Admitting: Family

## 2014-02-11 ENCOUNTER — Ambulatory Visit (INDEPENDENT_AMBULATORY_CARE_PROVIDER_SITE_OTHER): Payer: PRIVATE HEALTH INSURANCE | Admitting: Family

## 2014-02-11 ENCOUNTER — Encounter: Payer: Self-pay | Admitting: Family

## 2014-02-11 VITALS — BP 104/74 | HR 66 | Temp 97.6°F | Resp 16 | Ht 70.75 in | Wt 161.1 lb

## 2014-02-11 DIAGNOSIS — Z0000 Encounter for general adult medical examination without abnormal findings: Secondary | ICD-10-CM

## 2014-02-11 LAB — BASIC METABOLIC PANEL WITH GFR
BUN: 17 mg/dL (ref 6–23)
CALCIUM: 8.9 mg/dL (ref 8.4–10.5)
CO2: 28 mEq/L (ref 19–32)
CREATININE: 0.87 mg/dL (ref 0.50–1.35)
Chloride: 103 mEq/L (ref 96–112)
Glucose, Bld: 75 mg/dL (ref 70–99)
Potassium: 4.1 mEq/L (ref 3.5–5.3)
Sodium: 139 mEq/L (ref 135–145)

## 2014-02-11 LAB — HEPATIC FUNCTION PANEL
ALT: 15 U/L (ref 0–53)
AST: 20 U/L (ref 0–37)
Albumin: 4.4 g/dL (ref 3.5–5.2)
Alkaline Phosphatase: 111 U/L (ref 39–117)
BILIRUBIN DIRECT: 0.3 mg/dL (ref 0.0–0.3)
BILIRUBIN TOTAL: 1.3 mg/dL — AB (ref 0.2–1.2)
Indirect Bilirubin: 1 mg/dL (ref 0.2–1.2)
Total Protein: 6.7 g/dL (ref 6.0–8.3)

## 2014-02-11 LAB — CBC WITH DIFFERENTIAL/PLATELET
BASOS ABS: 0 10*3/uL (ref 0.0–0.1)
BASOS PCT: 0 % (ref 0–1)
Eosinophils Absolute: 0.1 10*3/uL (ref 0.0–0.7)
Eosinophils Relative: 2 % (ref 0–5)
HCT: 43.4 % (ref 39.0–52.0)
Hemoglobin: 15.9 g/dL (ref 13.0–17.0)
Lymphocytes Relative: 26 % (ref 12–46)
Lymphs Abs: 1 10*3/uL (ref 0.7–4.0)
MCH: 31.4 pg (ref 26.0–34.0)
MCHC: 36.6 g/dL — AB (ref 30.0–36.0)
MCV: 85.8 fL (ref 78.0–100.0)
MONO ABS: 0.3 10*3/uL (ref 0.1–1.0)
Monocytes Relative: 8 % (ref 3–12)
NEUTROS ABS: 2.4 10*3/uL (ref 1.7–7.7)
NEUTROS PCT: 64 % (ref 43–77)
PLATELETS: 132 10*3/uL — AB (ref 150–400)
RBC: 5.06 MIL/uL (ref 4.22–5.81)
RDW: 13.3 % (ref 11.5–15.5)
WBC: 3.7 10*3/uL — ABNORMAL LOW (ref 4.0–10.5)

## 2014-02-11 LAB — LIPID PANEL
CHOLESTEROL: 110 mg/dL (ref 0–200)
HDL: 35 mg/dL — ABNORMAL LOW (ref >39)
LDL Cholesterol: 64 mg/dL (ref 0–99)
TRIGLYCERIDES: 53 mg/dL (ref <150)
Total CHOL/HDL Ratio: 3.1 Ratio
VLDL: 11 mg/dL (ref 0–40)

## 2014-02-11 LAB — TSH: TSH: 2.197 u[IU]/mL (ref 0.350–4.500)

## 2014-02-11 NOTE — Assessment & Plan Note (Signed)
Continue healthy diet. Discussed adding regular cardio- he hopes to do more bike riding. Obtain fasting labs. Plan to start PSA at age 38 due to family history. Immunizations up to date.

## 2014-02-11 NOTE — Progress Notes (Signed)
Pre visit review using our clinic review tool, if applicable. No additional management support is needed unless otherwise documented below in the visit note. 

## 2014-02-11 NOTE — Patient Instructions (Addendum)
Please complete your lab work prior to leaving. Continue healthy diet. Try to get 30 minutes of cardio 5 days a week.  Follow up in 1 year.

## 2014-02-11 NOTE — Progress Notes (Signed)
Subjective:    Patient ID: Larry Brooks, male    DOB: Jul 24, 1976, 38 y.o.   MRN: [id number]  HPI  Patient presents today for complete physical.  Immunizations: up to date on tetanus Diet: tries to stay vegetarian, occasional egg whites Exercise: not exercising regularly    Review of Systems  Constitutional: Negative for unexpected weight change.  HENT: Negative for hearing loss.   Eyes: Negative for visual disturbance.  Respiratory: Negative for cough and shortness of breath.   Cardiovascular: Negative for chest pain.  Gastrointestinal: Negative for nausea, vomiting, diarrhea and constipation.  Genitourinary: Negative for dysuria and frequency.  Musculoskeletal: Negative for arthralgias and myalgias.  Skin: Negative for rash.  Neurological: Negative for light-headedness.  Psychiatric/Behavioral:       Denies depression/anxiety   Past Medical History  Diagnosis Date  . Childhood asthma   . Generalized anxiety disorder     History   Social History  . Marital Status: Married    Spouse Name: N/A    Number of Children: N/A  . Years of Education: N/A   Occupational History  . Not on file.   Social History Main Topics  . Smoking status: Never Smoker   . Smokeless tobacco: Never Used  . Alcohol Use: Yes     Comment: very rare-social  . Drug Use: No  . Sexual Activity: Not on file   Other Topics Concern  . Not on file   Social History Narrative   Works at [employer]   Married  2 sons   Enjoys cooking, playing with his kids, reading, fishing    Past Surgical History  Procedure Laterality Date  . Tonsillectomy    . Wisdom tooth extraction      Family History  Problem Relation Age of Onset  . Coronary artery disease Father     status post 4 stents   . Cancer Father     prostate  . Diabetes type II      grandfather  . Hyperlipidemia      grandfather  . Colon cancer Neg Hx     No Known Allergies  No current outpatient prescriptions on file  prior to visit.   No current facility-administered medications on file prior to visit.    BP 104/74  Pulse 66  Temp(Src) 97.6 F (36.4 C) (Oral)  Resp 16  Ht 5' 10.75" (1.797 m)  Wt 161 lb 1.3 oz (73.065 kg)  BMI 22.63 kg/m2  SpO2 99%       Objective:   Physical Exam  Physical Exam  Constitutional: He is oriented to person, place, and time. He appears well-developed and well-nourished. No distress.  HENT:  Head: Normocephalic and atraumatic.  Right Ear: Tympanic membrane and ear canal normal.  Left Ear: Tympanic membrane and ear canal normal.  Mouth/Throat: Oropharynx is clear and moist.  Eyes: Pupils are equal, round, and reactive to light. No scleral icterus.  Neck: Normal range of motion. No thyromegaly present.  Cardiovascular: Normal rate and regular rhythm.   No murmur heard. Pulmonary/Chest: Effort normal and breath sounds normal. No respiratory distress. He has no wheezes. He has no rales. He exhibits no tenderness.  Abdominal: Soft. Bowel sounds are normal. He exhibits no distension and no mass. There is no tenderness. There is no rebound and no guarding.  Musculoskeletal: He exhibits no edema.  Lymphadenopathy:    He has no cervical adenopathy.  Neurological: He is alert and oriented to person, place, and time. He has normal  patellar reflexes. He exhibits normal muscle tone. Coordination normal.  Skin: Skin is warm and dry.  Psychiatric: He has a normal mood and affect. His behavior is normal. Judgment and thought content normal.          Assessment & Plan:          Assessment & Plan:

## 2014-02-12 LAB — URINALYSIS, ROUTINE W REFLEX MICROSCOPIC
BILIRUBIN URINE: NEGATIVE
GLUCOSE, UA: NEGATIVE mg/dL
Hgb urine dipstick: NEGATIVE
Ketones, ur: NEGATIVE mg/dL
Leukocytes, UA: NEGATIVE
Nitrite: NEGATIVE
PROTEIN: NEGATIVE mg/dL
Specific Gravity, Urine: 1.02 (ref 1.005–1.030)
Urobilinogen, UA: 0.2 mg/dL (ref 0.0–1.0)
pH: 7 (ref 5.0–8.0)

## 2014-02-23 ENCOUNTER — Telehealth: Payer: Self-pay | Admitting: *Deleted

## 2014-02-23 DIAGNOSIS — D696 Thrombocytopenia, unspecified: Secondary | ICD-10-CM

## 2014-02-23 NOTE — Telephone Encounter (Signed)
Notified pt and he voices understanding. Lab order entered.

## 2014-02-23 NOTE — Telephone Encounter (Signed)
Message copied by Ronny Flurry on Mon Feb 23, 2014  2:56 PM ------      Message from: O'SULLIVAN, MELISSA      Created: Fri Feb 13, 2014  3:05 PM       Kidney function is normal. WBC and platelets mildly depressed which he has had in the past.  I would recommend that he repeat cbc in 1 month to recheck (dx is thrombycytopenia).  Bilirubin mildly elevated.  Normal for him, unlikely to be worrisome. HDL "good cholesterol" could be higher.  Recommend regular exercise to help this. Thyroid function is normal.  Urinalysis is normal. ------

## 2014-03-13 LAB — CBC WITH DIFFERENTIAL/PLATELET
Basophils Absolute: 0 10*3/uL (ref 0.0–0.1)
Basophils Relative: 0 % (ref 0–1)
EOS PCT: 2 % (ref 0–5)
Eosinophils Absolute: 0.1 10*3/uL (ref 0.0–0.7)
HEMATOCRIT: 43.1 % (ref 39.0–52.0)
HEMOGLOBIN: 15.3 g/dL (ref 13.0–17.0)
LYMPHS ABS: 0.9 10*3/uL (ref 0.7–4.0)
LYMPHS PCT: 28 % (ref 12–46)
MCH: 31.6 pg (ref 26.0–34.0)
MCHC: 35.5 g/dL (ref 30.0–36.0)
MCV: 89 fL (ref 78.0–100.0)
MONO ABS: 0.3 10*3/uL (ref 0.1–1.0)
MONOS PCT: 8 % (ref 3–12)
Neutro Abs: 2 10*3/uL (ref 1.7–7.7)
Neutrophils Relative %: 62 % (ref 43–77)
Platelets: 122 10*3/uL — ABNORMAL LOW (ref 150–400)
RBC: 4.84 MIL/uL (ref 4.22–5.81)
RDW: 13.4 % (ref 11.5–15.5)
WBC: 3.2 10*3/uL — AB (ref 4.0–10.5)

## 2014-03-16 ENCOUNTER — Telehealth: Payer: Self-pay | Admitting: Family

## 2014-03-16 DIAGNOSIS — D696 Thrombocytopenia, unspecified: Secondary | ICD-10-CM

## 2014-03-16 NOTE — Telephone Encounter (Signed)
Notified pt and he is agreeable to proceed with referral.

## 2014-03-16 NOTE — Telephone Encounter (Signed)
Left message on voicemail to return my call.

## 2014-03-16 NOTE — Telephone Encounter (Signed)
Please contact pt and let him know that his platelets are a little low.  They have been a little low on several occasions.  I would like for him to meet with hematology for consult and further evaluation.

## 2014-03-18 NOTE — Telephone Encounter (Signed)
Opened in error

## 2014-04-24 ENCOUNTER — Telehealth: Payer: Self-pay | Admitting: *Deleted

## 2014-04-24 NOTE — Telephone Encounter (Signed)
Pt left message that he never received a copy of his test results from March or April. He requests that copies be mailed to him.  Advised pt that I will also release results to mychart and mailed hard copy.

## 2014-04-29 ENCOUNTER — Telehealth: Payer: Self-pay | Admitting: Hematology & Oncology

## 2014-04-29 NOTE — Telephone Encounter (Signed)
Left vm w NEW PATIENT today to remind them of their appointment with Dr. Ennever. Also, advised them to bring all medication bottles and insurance card information. ° °

## 2014-04-30 ENCOUNTER — Ambulatory Visit (HOSPITAL_BASED_OUTPATIENT_CLINIC_OR_DEPARTMENT_OTHER): Payer: PRIVATE HEALTH INSURANCE | Admitting: Hematology & Oncology

## 2014-04-30 ENCOUNTER — Other Ambulatory Visit (HOSPITAL_BASED_OUTPATIENT_CLINIC_OR_DEPARTMENT_OTHER): Payer: PRIVATE HEALTH INSURANCE | Admitting: Lab

## 2014-04-30 ENCOUNTER — Encounter: Payer: Self-pay | Admitting: Hematology & Oncology

## 2014-04-30 ENCOUNTER — Ambulatory Visit: Payer: PRIVATE HEALTH INSURANCE

## 2014-04-30 VITALS — BP 117/67 | HR 70 | Temp 98.4°F | Resp 18 | Ht 71.0 in | Wt 165.0 lb

## 2014-04-30 DIAGNOSIS — D696 Thrombocytopenia, unspecified: Secondary | ICD-10-CM

## 2014-04-30 DIAGNOSIS — D72819 Decreased white blood cell count, unspecified: Secondary | ICD-10-CM

## 2014-04-30 DIAGNOSIS — Z8042 Family history of malignant neoplasm of prostate: Secondary | ICD-10-CM

## 2014-04-30 LAB — CBC WITH DIFFERENTIAL (CANCER CENTER ONLY)
BASO#: 0 10*3/uL (ref 0.0–0.2)
BASO%: 0.5 % (ref 0.0–2.0)
EOS%: 4 % (ref 0.0–7.0)
Eosinophils Absolute: 0.2 10*3/uL (ref 0.0–0.5)
HCT: 42.7 % (ref 38.7–49.9)
HGB: 15.9 g/dL (ref 13.0–17.1)
LYMPH#: 1.1 10*3/uL (ref 0.9–3.3)
LYMPH%: 28.4 % (ref 14.0–48.0)
MCH: 32.7 pg (ref 28.0–33.4)
MCHC: 37.2 g/dL — ABNORMAL HIGH (ref 32.0–35.9)
MCV: 88 fL (ref 82–98)
MONO#: 0.3 10*3/uL (ref 0.1–0.9)
MONO%: 7 % (ref 0.0–13.0)
NEUT#: 2.4 10*3/uL (ref 1.5–6.5)
NEUT%: 60.1 % (ref 40.0–80.0)
RBC: 4.86 10*6/uL (ref 4.20–5.70)
RDW: 12.9 % (ref 11.1–15.7)
WBC: 4 10*3/uL (ref 4.0–10.0)

## 2014-04-30 LAB — CHCC SATELLITE - SMEAR
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
## 2014-05-01 ENCOUNTER — Telehealth: Payer: Self-pay | Admitting: Hematology & Oncology

## 2014-05-01 LAB — HEPATITIS PANEL, ACUTE
HCV Ab: NEGATIVE
HEP B S AG: NEGATIVE
Hep A IgM: NONREACTIVE
Hep B C IgM: NONREACTIVE

## 2014-05-01 LAB — HIV ANTIBODY (ROUTINE TESTING W REFLEX): HIV 1&2 Ab, 4th Generation: NONREACTIVE

## 2014-05-01 LAB — CARDIOLIPIN ANTIBODIES, IGG, IGM, IGA
ANTICARDIOLIPIN IGG: 9 GPL U/mL (ref <23)
ANTICARDIOLIPIN IGM: 14 [MPL'U]/mL — AB (ref <11)
Anticardiolipin IgA: 7 APL U/mL (ref <22)

## 2014-05-01 LAB — LACTATE DEHYDROGENASE: LDH: 139 U/L (ref 94–250)

## 2014-05-01 LAB — VITAMIN B12: Vitamin B-12: 283 pg/mL (ref 211–911)

## 2014-05-01 NOTE — Telephone Encounter (Signed)
Mailed sept schedule

## 2014-05-04 ENCOUNTER — Telehealth: Payer: Self-pay | Admitting: *Deleted

## 2014-05-04 NOTE — Telephone Encounter (Signed)
Message copied by Rico Ala on Mon May 04, 2014  9:40 AM ------      Message from: Volanda Napoleon      Created: Sat May 02, 2014  9:41 PM       Call - so far, all tests are normal!!!! I still think that the low platelets are from your immune system being active and removing the platelets sooner than normal.  Pete ------

## 2014-05-04 NOTE — Telephone Encounter (Signed)
Called patient and left message on patients personal cell phone that so far, all tests are normal!!!! I still think that the low platelets are from your immune system being active and removing the platelets sooner than normal

## 2014-05-05 LAB — LUPUS ANTICOAGULANT PANEL
DRVVT: 40 secs (ref <42.9)
Lupus Anticoagulant: NOT DETECTED
PTT LA: 38.4 s (ref 28.0–43.0)

## 2014-07-28 ENCOUNTER — Telehealth: Payer: Self-pay | Admitting: Hematology & Oncology

## 2014-07-28 NOTE — Telephone Encounter (Signed)
Patient called and cx 07/30/14 appt and resch for 07/29/14

## 2014-07-29 ENCOUNTER — Other Ambulatory Visit (HOSPITAL_BASED_OUTPATIENT_CLINIC_OR_DEPARTMENT_OTHER): Payer: PRIVATE HEALTH INSURANCE | Admitting: Lab

## 2014-07-29 ENCOUNTER — Encounter: Payer: Self-pay | Admitting: Hematology & Oncology

## 2014-07-29 ENCOUNTER — Ambulatory Visit (HOSPITAL_BASED_OUTPATIENT_CLINIC_OR_DEPARTMENT_OTHER): Payer: PRIVATE HEALTH INSURANCE | Admitting: Hematology & Oncology

## 2014-07-29 VITALS — BP 116/77 | HR 66 | Temp 97.8°F | Resp 18 | Ht 70.0 in | Wt 165.0 lb

## 2014-07-29 DIAGNOSIS — D696 Thrombocytopenia, unspecified: Secondary | ICD-10-CM

## 2014-07-29 LAB — CBC WITH DIFFERENTIAL (CANCER CENTER ONLY)
BASO#: 0 10*3/uL (ref 0.0–0.2)
BASO%: 0.5 % (ref 0.0–2.0)
EOS%: 5.9 % (ref 0.0–7.0)
Eosinophils Absolute: 0.2 10*3/uL (ref 0.0–0.5)
HCT: 42.4 % (ref 38.7–49.9)
HGB: 15.4 g/dL (ref 13.0–17.1)
LYMPH#: 1 10*3/uL (ref 0.9–3.3)
LYMPH%: 27 % (ref 14.0–48.0)
MCH: 32.2 pg (ref 28.0–33.4)
MCV: 89 fL (ref 82–98)
MONO#: 0.3 10*3/uL (ref 0.1–0.9)
MONO%: 7.3 % (ref 0.0–13.0)
NEUT%: 59.3 % (ref 40.0–80.0)
NEUTROS ABS: 2.2 10*3/uL (ref 1.5–6.5)
Platelets: 146 10*3/uL (ref 145–400)
RBC: 4.78 10*6/uL (ref 4.20–5.70)
RDW: 13.3 % (ref 11.1–15.7)
WBC: 3.7 10*3/uL — ABNORMAL LOW (ref 4.0–10.0)

## 2014-07-29 LAB — CHCC SATELLITE - SMEAR

## 2014-07-30 ENCOUNTER — Ambulatory Visit: Payer: PRIVATE HEALTH INSURANCE | Admitting: Hematology & Oncology

## 2014-07-30 ENCOUNTER — Other Ambulatory Visit: Payer: PRIVATE HEALTH INSURANCE | Admitting: Lab

## 2014-07-30 NOTE — Progress Notes (Signed)
  Hematology and Oncology Follow Up Visit  Larry Brooks [id number] Dec 29, 1975 38 y.o. 07/30/2014   Principle Diagnosis:   Thrombocytopenia-likely mild immune-based  Current Therapy:    Observation     Interim History:  Larry Brooks is back for his followup. We first saw him back in June. All of our studies came back negative for the thrombocytopenia. I suspect that he may have mild immune-based thrombocytopenia.  He's had no bruising or bleeding. He's been working without difficulty. He's had no problems with weight loss or weight gain. His appetite has been okay. There's been no nausea or vomiting. He's had no change in bowel or bladder habits. He's had no rashes. He's had no leg swelling. He's had no weakness.  He's getting ready to go to the beach after Labor Day weekend.  Medications: Current outpatient prescriptions:Naproxen Sodium (ALEVE PO), Take by mouth as needed., Disp: , Rfl:   Allergies: No Known Allergies  Past Medical History, Surgical history, Social history, and Family History were reviewed and updated.  Review of Systems: As above  Physical Exam:  height is 5' 10"  (1.778 m) and weight is 165 lb (74.844 kg). His oral temperature is 97.8 F (36.6 C). His blood pressure is 116/77 and his pulse is 66. His respiration is 18.   Well-developed and well-nourished white gentleman. Head and neck exam shows no ocular or oral lesions. There is no palpable cervical or supraclavicular lymph nodes. Lungs are clear. Cardiac exam regular in rhythm. He has no murmurs. Abdomen soft. Has good bowel sounds. There is no palpable liver or spleen tip. Extremities shows no clubbing, cyanosis or edema. Skin exam shows no rashes, ecchymoses or petechia. Neurological exam is nonfocal.  Lab Results  Component Value Date   WBC 3.7* 07/29/2014   HGB 15.4 07/29/2014   HCT 42.4 07/29/2014   MCV 89 07/29/2014   PLT 146 Platelet count consistent in citrate 07/29/2014     Chemistry      Component  Value Date/Time   NA 139 02/11/2014 0940   K 4.1 02/11/2014 0940   CL 103 02/11/2014 0940   CO2 28 02/11/2014 0940   BUN 17 02/11/2014 0940   CREATININE 0.87 02/11/2014 0940   CREATININE 1.0 12/19/2007 0853      Component Value Date/Time   CALCIUM 8.9 02/11/2014 0940   ALKPHOS 111 02/11/2014 0940   AST 20 02/11/2014 0940   ALT 15 02/11/2014 0940   BILITOT 1.3* 02/11/2014 0940         Impression and Plan: Larry Brooks is a 38 year old gentleman. He has mild thrombocytopenia. His platelet count is actually normal today.  I think that his platelet count will fluctuate. I think the thrombocytopenia will "come and go". He is asymptomatic with this. I really do not think that he needs any type of intervention for this.  We will let him go from the clinic. He can be followed up by his family doctor. I don't think he needs anything special.  It wasfun seeing him. I am just glad that he is doing okay with no obvious blood problem.   Volanda Napoleon, MD 9/3/20157:18 AM

## 2014-09-07 ENCOUNTER — Telehealth: Payer: Self-pay | Admitting: *Deleted

## 2014-09-07 NOTE — Telephone Encounter (Signed)
Received medical records request via fax from [facility]. Forms forwarded to [facility]. JG//CMA

## 2014-09-15 ENCOUNTER — Telehealth: Payer: Self-pay | Admitting: Hematology & Oncology

## 2014-09-15 NOTE — Telephone Encounter (Signed)
Faxed medical records to:  [facility] P: [phone] F: [phone] or [phone]      COPY SCANNED

## 2014-10-08 ENCOUNTER — Telehealth: Payer: Self-pay | Admitting: *Deleted

## 2014-10-08 NOTE — Telephone Encounter (Signed)
Received medical record request via mail from [facility]. Forms forwarded to [facility]. JG//CMA

## 2015-02-16 ENCOUNTER — Telehealth: Payer: Self-pay

## 2015-02-16 ENCOUNTER — Encounter: Payer: PRIVATE HEALTH INSURANCE | Admitting: Family

## 2015-02-16 NOTE — Telephone Encounter (Signed)
See speciality notes

## 2015-02-17 ENCOUNTER — Encounter: Payer: Self-pay | Admitting: Family

## 2015-02-17 ENCOUNTER — Ambulatory Visit (INDEPENDENT_AMBULATORY_CARE_PROVIDER_SITE_OTHER): Payer: BLUE CROSS/BLUE SHIELD | Admitting: Family

## 2015-02-17 VITALS — BP 100/76 | HR 72 | Temp 97.6°F | Resp 16 | Ht 71.0 in | Wt 163.6 lb

## 2015-02-17 DIAGNOSIS — Z0000 Encounter for general adult medical examination without abnormal findings: Secondary | ICD-10-CM | POA: Diagnosis not present

## 2015-02-17 LAB — URINALYSIS, ROUTINE W REFLEX MICROSCOPIC
BILIRUBIN URINE: NEGATIVE
Hgb urine dipstick: NEGATIVE
Ketones, ur: NEGATIVE
Leukocytes, UA: NEGATIVE
Nitrite: NEGATIVE
PH: 6 (ref 5.0–8.0)
RBC / HPF: NONE SEEN (ref 0–?)
Specific Gravity, Urine: 1.01 (ref 1.000–1.030)
TOTAL PROTEIN, URINE-UPE24: NEGATIVE
URINE GLUCOSE: NEGATIVE
Urobilinogen, UA: 0.2 (ref 0.0–1.0)
WBC UA: NONE SEEN (ref 0–?)

## 2015-02-17 LAB — HEPATIC FUNCTION PANEL
ALT: 21 U/L (ref 0–53)
AST: 24 U/L (ref 0–37)
Albumin: 4.6 g/dL (ref 3.5–5.2)
Alkaline Phosphatase: 96 U/L (ref 39–117)
BILIRUBIN DIRECT: 0.3 mg/dL (ref 0.0–0.3)
TOTAL PROTEIN: 7.1 g/dL (ref 6.0–8.3)
Total Bilirubin: 1.4 mg/dL — ABNORMAL HIGH (ref 0.2–1.2)

## 2015-02-17 LAB — LIPID PANEL
CHOLESTEROL: 125 mg/dL (ref 0–200)
HDL: 37.6 mg/dL — AB (ref >39.00)
LDL Cholesterol: 75 mg/dL (ref 0–99)
NonHDL: 87.4
TRIGLYCERIDES: 62 mg/dL (ref 0.0–149.0)
Total CHOL/HDL Ratio: 3
VLDL: 12.4 mg/dL (ref 0.0–40.0)

## 2015-02-17 LAB — CBC WITH DIFFERENTIAL/PLATELET
BASOS ABS: 0 10*3/uL (ref 0.0–0.1)
Basophils Relative: 0.4 % (ref 0.0–3.0)
Eosinophils Absolute: 0.1 10*3/uL (ref 0.0–0.7)
Eosinophils Relative: 1.6 % (ref 0.0–5.0)
HCT: 45.5 % (ref 39.0–52.0)
Hemoglobin: 16.2 g/dL (ref 13.0–17.0)
LYMPHS PCT: 25.8 % (ref 12.0–46.0)
Lymphs Abs: 1.1 10*3/uL (ref 0.7–4.0)
MCHC: 35.5 g/dL (ref 30.0–36.0)
MCV: 89.9 fl (ref 78.0–100.0)
MONOS PCT: 9.1 % (ref 3.0–12.0)
Monocytes Absolute: 0.4 10*3/uL (ref 0.1–1.0)
Neutro Abs: 2.6 10*3/uL (ref 1.4–7.7)
Neutrophils Relative %: 63.1 % (ref 43.0–77.0)
Platelets: 139 10*3/uL — ABNORMAL LOW (ref 150.0–400.0)
RBC: 5.05 Mil/uL (ref 4.22–5.81)
RDW: 13.3 % (ref 11.5–15.5)
WBC: 4.2 10*3/uL (ref 4.0–10.5)

## 2015-02-17 LAB — BASIC METABOLIC PANEL
BUN: 16 mg/dL (ref 6–23)
CO2: 27 meq/L (ref 19–32)
Calcium: 9.5 mg/dL (ref 8.4–10.5)
Chloride: 104 mEq/L (ref 96–112)
Creatinine, Ser: 1.1 mg/dL (ref 0.40–1.50)
GFR: 79.43 mL/min (ref >60.00)
GLUCOSE: 84 mg/dL (ref 70–99)
POTASSIUM: 4.1 meq/L (ref 3.5–5.1)
Sodium: 138 mEq/L (ref 135–145)

## 2015-02-17 LAB — TSH: TSH: 2.74 u[IU]/mL (ref 0.35–4.50)

## 2015-02-17 NOTE — Progress Notes (Signed)
Subjective:    Patient ID: Larry Brooks, male    DOB: 09-17-1976, 39 y.o.   MRN: [id number]  HPI  Patient presents today for complete physical.  Immunizations: Tetanus is up to date.   Diet: mainly vegan, rare chicken or red meat Exercise: active at work, no formal exercise at work.  Dental:  due  Review of Systems  HENT: Negative for hearing loss and rhinorrhea.   Eyes: Negative for visual disturbance.  Respiratory: Negative for cough and shortness of breath.   Cardiovascular: Negative for chest pain and leg swelling.  Gastrointestinal: Negative for abdominal pain, diarrhea and constipation.  Genitourinary: Negative for dysuria and frequency.  Musculoskeletal: Negative for myalgias and arthralgias.  Skin: Negative for rash.  Neurological: Negative for facial asymmetry.  Hematological: Negative for adenopathy.  Psychiatric/Behavioral:       Denies depression/anxiety   Past Medical History  Diagnosis Date  . Childhood asthma   . Generalized anxiety disorder   . Thrombocytopenia   . Leukopenia     History   Social History  . Marital Status: Married    Spouse Name: N/A  . Number of Children: N/A  . Years of Education: N/A   Occupational History  . Not on file.   Social History Main Topics  . Smoking status: Never Smoker   . Smokeless tobacco: Never Used     Comment: never used tobacco  . Alcohol Use: Yes     Comment: very rare-social  . Drug Use: No  . Sexual Activity: Not on file   Other Topics Concern  . Not on file   Social History Narrative   Works at [employer]   Married  2 sons   Enjoys cooking, playing with his kids, reading, fishing    Past Surgical History  Procedure Laterality Date  . Tonsillectomy    . Wisdom tooth extraction      Family History  Problem Relation Age of Onset  . Coronary artery disease Father     status post 4 stents   . Cancer Father     prostate  . Diabetes type II      grandfather  . Hyperlipidemia     grandfather  . Colon cancer Neg Hx     No Known Allergies  No current outpatient prescriptions on file prior to visit.   No current facility-administered medications on file prior to visit.    BP 100/76 mmHg  Pulse 72  Temp(Src) 97.6 F (36.4 C) (Oral)  Resp 16  Ht 5' 11"  (1.803 m)  Wt 163 lb 9.6 oz (74.208 kg)  BMI 22.83 kg/m2  SpO2 99%       Objective:   Physical Exam  Physical Exam  Constitutional: He is oriented to person, place, and time. He appears well-developed and well-nourished. No distress.  HENT:  Head: Normocephalic and atraumatic.  Right Ear: Tympanic membrane and ear canal normal.  Left Ear: Tympanic membrane and ear canal normal.  Mouth/Throat: Oropharynx is clear and moist.  Eyes: Pupils are equal, round, and reactive to light. No scleral icterus.  Neck: Normal range of motion. No thyromegaly present.  Cardiovascular: Normal rate and regular rhythm.   No murmur heard. Pulmonary/Chest: Effort normal and breath sounds normal. No respiratory distress. He has no wheezes. He has no rales. He exhibits no tenderness.  Abdominal: Soft. Bowel sounds are normal. He exhibits no distension and no mass. There is no tenderness. There is no rebound and no guarding.  Musculoskeletal: He exhibits  no edema.  Lymphadenopathy:    He has no cervical adenopathy.  Neurological: He is alert and oriented to person, place, and time. He has normal patellar reflexes. He exhibits normal muscle tone. Coordination normal.  Skin: Skin is warm and dry.  Psychiatric: He has a normal mood and affect. His behavior is normal. Judgment and thought content normal.          Assessment & Plan:         Assessment & Plan:

## 2015-02-17 NOTE — Patient Instructions (Signed)
Please complete lab work prior to leaving. Keep up the good work with healthy diet, exercise. Please follow up in 1 year.  Follow up sooner if problems or concerns.

## 2015-02-17 NOTE — Assessment & Plan Note (Addendum)
Immunizations reviewed and up to date. Continue healthy diet, exercise.  Follow up in 1 year.

## 2015-02-19 ENCOUNTER — Encounter: Payer: Self-pay | Admitting: Family

## 2016-02-17 ENCOUNTER — Telehealth: Payer: Self-pay | Admitting: Behavioral Health

## 2016-02-17 NOTE — Telephone Encounter (Signed)
Unable to reach patient at time of Pre-Visit Call.  Left message for patient to return call when available.    

## 2016-02-18 ENCOUNTER — Encounter: Payer: Self-pay | Admitting: Family

## 2016-02-18 ENCOUNTER — Ambulatory Visit (INDEPENDENT_AMBULATORY_CARE_PROVIDER_SITE_OTHER): Payer: BLUE CROSS/BLUE SHIELD | Admitting: Family

## 2016-02-18 VITALS — BP 108/70 | HR 63 | Temp 97.7°F | Resp 16

## 2016-02-18 DIAGNOSIS — Z0000 Encounter for general adult medical examination without abnormal findings: Secondary | ICD-10-CM | POA: Diagnosis not present

## 2016-02-18 LAB — BASIC METABOLIC PANEL
BUN: 18 mg/dL (ref 6–23)
CO2: 29 meq/L (ref 19–32)
Calcium: 9.2 mg/dL (ref 8.4–10.5)
Chloride: 105 mEq/L (ref 96–112)
Creatinine, Ser: 1.16 mg/dL (ref 0.40–1.50)
GFR: 74.32 mL/min (ref >60.00)
GLUCOSE: 83 mg/dL (ref 70–99)
POTASSIUM: 4.1 meq/L (ref 3.5–5.1)
SODIUM: 139 meq/L (ref 135–145)

## 2016-02-18 LAB — URINALYSIS, ROUTINE W REFLEX MICROSCOPIC
Bilirubin Urine: NEGATIVE
Hgb urine dipstick: NEGATIVE
Ketones, ur: NEGATIVE
Leukocytes, UA: NEGATIVE
Nitrite: NEGATIVE
PH: 6.5 (ref 5.0–8.0)
SPECIFIC GRAVITY, URINE: 1.015 (ref 1.000–1.030)
TOTAL PROTEIN, URINE-UPE24: NEGATIVE
URINE GLUCOSE: NEGATIVE
Urobilinogen, UA: 1 (ref 0.0–1.0)

## 2016-02-18 LAB — HEPATIC FUNCTION PANEL
ALK PHOS: 96 U/L (ref 39–117)
ALT: 13 U/L (ref 0–53)
AST: 17 U/L (ref 0–37)
Albumin: 4.4 g/dL (ref 3.5–5.2)
BILIRUBIN DIRECT: 0.2 mg/dL (ref 0.0–0.3)
BILIRUBIN TOTAL: 1.1 mg/dL (ref 0.2–1.2)
Total Protein: 6.9 g/dL (ref 6.0–8.3)

## 2016-02-18 LAB — LIPID PANEL
CHOL/HDL RATIO: 3
Cholesterol: 114 mg/dL (ref 0–200)
HDL: 33.8 mg/dL — ABNORMAL LOW (ref >39.00)
LDL CALC: 70 mg/dL (ref 0–99)
NONHDL: 79.8
Triglycerides: 48 mg/dL (ref 0.0–149.0)
VLDL: 9.6 mg/dL (ref 0.0–40.0)

## 2016-02-18 LAB — CBC WITH DIFFERENTIAL/PLATELET
BASOS PCT: 0.5 % (ref 0.0–3.0)
Basophils Absolute: 0 10*3/uL (ref 0.0–0.1)
EOS ABS: 0.1 10*3/uL (ref 0.0–0.7)
EOS PCT: 2.1 % (ref 0.0–5.0)
HEMATOCRIT: 44.5 % (ref 39.0–52.0)
HEMOGLOBIN: 15.3 g/dL (ref 13.0–17.0)
LYMPHS PCT: 24.2 % (ref 12.0–46.0)
Lymphs Abs: 1 10*3/uL (ref 0.7–4.0)
MCHC: 34.5 g/dL (ref 30.0–36.0)
MCV: 91.1 fl (ref 78.0–100.0)
Monocytes Absolute: 0.4 10*3/uL (ref 0.1–1.0)
Monocytes Relative: 8.8 % (ref 3.0–12.0)
NEUTROS ABS: 2.6 10*3/uL (ref 1.4–7.7)
Neutrophils Relative %: 64.4 % (ref 43.0–77.0)
Platelets: 140 10*3/uL — ABNORMAL LOW (ref 150.0–400.0)
RBC: 4.88 Mil/uL (ref 4.22–5.81)
RDW: 12.9 % (ref 11.5–15.5)
WBC: 4.1 10*3/uL (ref 4.0–10.5)

## 2016-02-18 LAB — TSH: TSH: 2.57 u[IU]/mL (ref 0.35–4.50)

## 2016-02-18 NOTE — Progress Notes (Signed)
Subjective:    Patient ID: Larry Brooks, male    DOB: 1976/04/09, 40 y.o.   MRN: [id number]  HPI  Patient presents today for complete physical.  Immunizations: declines flu shot,  Td up to date Diet: tries to eat less meat, more fresh food.  Exercise: 3 times a week, treadmill 3 miles each visit.   Wt Readings from Last 3 Encounters:  02/17/15 163 lb 9.6 oz (74.208 kg)  07/29/14 165 lb (74.844 kg)  04/30/14 165 lb (74.844 kg)  vision:  Up to date Dental:  Up to date  Review of Systems  Constitutional: Negative for unexpected weight change.  HENT: Positive for rhinorrhea. Negative for hearing loss.   Eyes: Negative for visual disturbance.  Respiratory: Negative for cough.   Cardiovascular: Negative for leg swelling.  Gastrointestinal: Negative for diarrhea and constipation.  Genitourinary: Negative for dysuria, frequency and hematuria.  Musculoskeletal: Negative for myalgias and arthralgias.  Skin: Negative for rash.  Neurological: Negative for headaches.  Hematological: Negative for adenopathy.  Psychiatric/Behavioral:       Denies depression/anxiety   Past Medical History  Diagnosis Date  . Childhood asthma   . Generalized anxiety disorder   . Thrombocytopenia ([place])   . Leukopenia     Social History   Social History  . Marital Status: Married    Spouse Name: N/A  . Number of Children: N/A  . Years of Education: N/A   Occupational History  . Not on file.   Social History Main Topics  . Smoking status: Never Smoker   . Smokeless tobacco: Never Used     Comment: never used tobacco  . Alcohol Use: Yes     Comment: very rare-social  . Drug Use: No  . Sexual Activity: Not on file   Other Topics Concern  . Not on file   Social History Narrative   Works at [employer]   Married  2 sons   Enjoys cooking, playing with his kids, reading, fishing    Past Surgical History  Procedure Laterality Date  . Tonsillectomy    . Wisdom tooth extraction       Family History  Problem Relation Age of Onset  . Coronary artery disease Father     status post 4 stents   . Cancer Father     prostate  . Diabetes type II      grandfather  . Hyperlipidemia      grandfather  . Colon cancer Neg Hx     No Known Allergies  No current outpatient prescriptions on file prior to visit.   No current facility-administered medications on file prior to visit.    BP 108/70 mmHg  Pulse 63  Temp(Src) 97.7 F (36.5 C) (Oral)  Resp 16  SpO2 100%       Objective:   Physical Exam  Physical Exam  Constitutional: He is oriented to person, place, and time. He appears well-developed and well-nourished. No distress.  HENT:  Head: Normocephalic and atraumatic.  Right Ear: Tympanic membrane and ear canal normal.  Left Ear: Tympanic membrane and ear canal normal.  Mouth/Throat: Oropharynx is clear and moist.  Eyes: Pupils are equal, round, and reactive to light. No scleral icterus.  Neck: Normal range of motion. No thyromegaly present.  Cardiovascular: Normal rate and regular rhythm.   No murmur heard. Pulmonary/Chest: Effort normal and breath sounds normal. No respiratory distress. He has no wheezes. He has no rales. He exhibits no tenderness.  Abdominal: Soft. Bowel sounds  are normal. He exhibits no distension and no mass. There is no tenderness. There is no rebound and no guarding.  Musculoskeletal: He exhibits no edema.  Lymphadenopathy:    He has no cervical adenopathy.  Neurological: He is alert and oriented to person, place, and time. He has normal patellar reflexes. He exhibits normal muscle tone. Coordination normal.  Skin: Skin is warm and dry.  Psychiatric: He has a normal mood and affect. His behavior is normal. Judgment and thought content normal.          Assessment & Plan:         Assessment & Plan:

## 2016-02-18 NOTE — Patient Instructions (Signed)
Please complete lab work prior to leaving. Follow up in 1 year for annual physical. Keep up the great work with healthy diet and exercise.

## 2016-02-18 NOTE — Progress Notes (Signed)
Pre visit review using our clinic review tool, if applicable. No additional management support is needed unless otherwise documented below in the visit note. 

## 2016-02-18 NOTE — Assessment & Plan Note (Signed)
Continue healthy diet, exercise.  Obtain routine lab work.

## 2017-02-19 ENCOUNTER — Encounter: Payer: Self-pay | Admitting: Family

## 2017-02-19 ENCOUNTER — Ambulatory Visit (INDEPENDENT_AMBULATORY_CARE_PROVIDER_SITE_OTHER): Payer: BLUE CROSS/BLUE SHIELD | Admitting: Family

## 2017-02-19 VITALS — BP 114/70 | HR 67 | Temp 98.3°F | Resp 16 | Ht 71.0 in | Wt 182.2 lb

## 2017-02-19 DIAGNOSIS — Z0000 Encounter for general adult medical examination without abnormal findings: Secondary | ICD-10-CM | POA: Diagnosis not present

## 2017-02-19 LAB — URINALYSIS, ROUTINE W REFLEX MICROSCOPIC
BILIRUBIN URINE: NEGATIVE
HGB URINE DIPSTICK: NEGATIVE
Ketones, ur: NEGATIVE
LEUKOCYTES UA: NEGATIVE
NITRITE: NEGATIVE
Specific Gravity, Urine: 1.025 (ref 1.000–1.030)
TOTAL PROTEIN, URINE-UPE24: NEGATIVE
URINE GLUCOSE: NEGATIVE
Urobilinogen, UA: 0.2 (ref 0.0–1.0)
pH: 6 (ref 5.0–8.0)

## 2017-02-19 LAB — HEPATIC FUNCTION PANEL
ALK PHOS: 75 U/L (ref 39–117)
ALT: 20 U/L (ref 0–53)
AST: 19 U/L (ref 0–37)
Albumin: 4.5 g/dL (ref 3.5–5.2)
BILIRUBIN DIRECT: 0.2 mg/dL (ref 0.0–0.3)
TOTAL PROTEIN: 6.8 g/dL (ref 6.0–8.3)
Total Bilirubin: 0.9 mg/dL (ref 0.2–1.2)

## 2017-02-19 LAB — CBC WITH DIFFERENTIAL/PLATELET
BASOS ABS: 0 10*3/uL (ref 0.0–0.1)
Basophils Relative: 0.7 % (ref 0.0–3.0)
EOS ABS: 0.1 10*3/uL (ref 0.0–0.7)
Eosinophils Relative: 2.9 % (ref 0.0–5.0)
HCT: 44.3 % (ref 39.0–52.0)
HEMOGLOBIN: 15.4 g/dL (ref 13.0–17.0)
LYMPHS ABS: 1 10*3/uL (ref 0.7–4.0)
Lymphocytes Relative: 29.4 % (ref 12.0–46.0)
MCHC: 34.8 g/dL (ref 30.0–36.0)
MCV: 90.8 fl (ref 78.0–100.0)
Monocytes Absolute: 0.3 10*3/uL (ref 0.1–1.0)
Monocytes Relative: 8 % (ref 3.0–12.0)
NEUTROS PCT: 59 % (ref 43.0–77.0)
Neutro Abs: 2.1 10*3/uL (ref 1.4–7.7)
Platelets: 144 10*3/uL — ABNORMAL LOW (ref 150.0–400.0)
RBC: 4.88 Mil/uL (ref 4.22–5.81)
RDW: 13.4 % (ref 11.5–15.5)
WBC: 3.6 10*3/uL — AB (ref 4.0–10.5)

## 2017-02-19 LAB — LIPID PANEL
CHOL/HDL RATIO: 3
Cholesterol: 131 mg/dL (ref 0–200)
HDL: 39.8 mg/dL (ref >39.00)
LDL Cholesterol: 82 mg/dL (ref 0–99)
NONHDL: 91.04
Triglycerides: 46 mg/dL (ref 0.0–149.0)
VLDL: 9.2 mg/dL (ref 0.0–40.0)

## 2017-02-19 LAB — BASIC METABOLIC PANEL
BUN: 22 mg/dL (ref 6–23)
CALCIUM: 9 mg/dL (ref 8.4–10.5)
CO2: 27 mEq/L (ref 19–32)
CREATININE: 0.99 mg/dL (ref 0.40–1.50)
Chloride: 107 mEq/L (ref 96–112)
GFR: 88.78 mL/min (ref >60.00)
Glucose, Bld: 85 mg/dL (ref 70–99)
POTASSIUM: 3.7 meq/L (ref 3.5–5.1)
Sodium: 141 mEq/L (ref 135–145)

## 2017-02-19 LAB — TSH: TSH: 2.93 u[IU]/mL (ref 0.35–4.50)

## 2017-02-19 NOTE — Progress Notes (Signed)
Pre visit review using our clinic review tool, if applicable. No additional management support is needed unless otherwise documented below in the visit note. 

## 2017-02-19 NOTE — Progress Notes (Signed)
Subjective:    Patient ID: Larry Brooks, male    DOB: 12-25-1975, 41 y.o.   MRN: [id number]  HPI  Patient presents today for complete physical.  Immunizations: up to date Diet:working on healthy diet, cutting down on meat and sweets.   Exercise: walking a lot.  Has a stand up desk at work Vision: up to date Dental: up to date Wt Readings from Last 3 Encounters:  02/19/17 182 lb 3.2 oz (82.6 kg)  02/17/15 163 lb 9.6 oz (74.2 kg)  07/29/14 165 lb (74.8 kg)    Review of Systems  Constitutional: Negative for unexpected weight change.  HENT: Negative for hearing loss and rhinorrhea.   Eyes: Negative for visual disturbance.  Respiratory: Negative for cough.   Cardiovascular: Negative for leg swelling.  Gastrointestinal: Negative for blood in stool, constipation and diarrhea.  Genitourinary: Negative for dysuria and frequency.  Musculoskeletal: Negative for arthralgias and myalgias.  Skin: Negative for rash.  Neurological: Negative for headaches.  Hematological: Negative for adenopathy.  Psychiatric/Behavioral:       Denies depression/anxiety   Past Medical History:  Diagnosis Date  . Childhood asthma   . Generalized anxiety disorder   . Leukopenia   . Thrombocytopenia [facility])      Social History   Social History  . Marital status: Married    Spouse name: N/A  . Number of children: N/A  . Years of education: N/A   Occupational History  . Not on file.   Social History Main Topics  . Smoking status: Never Smoker  . Smokeless tobacco: Never Used     Comment: never used tobacco  . Alcohol use Yes     Comment: very rare-social  . Drug use: No  . Sexual activity: Not on file   Other Topics Concern  . Not on file   Social History Narrative   Works at [employer]   Married  2 sons   Enjoys cooking, playing with his kids, reading, fishing    Past Surgical History:  Procedure Laterality Date  . TONSILLECTOMY    . WISDOM TOOTH EXTRACTION      Family  History  Problem Relation Age of Onset  . Coronary artery disease Father     status post 4 stents   . Cancer Father     prostate  . Diabetes type II      grandfather  . Hyperlipidemia      grandfather  . Colon cancer Neg Hx     No Known Allergies  No current outpatient prescriptions on file prior to visit.   No current facility-administered medications on file prior to visit.     BP 114/70 (BP Location: Left Arm, Cuff Size: Normal)   Pulse 67   Temp 98.3 F (36.8 C) (Oral)   Resp 16   Ht 5' 11"  (1.803 m)   Wt 182 lb 3.2 oz (82.6 kg)   SpO2 100% Comment: room air  BMI 25.41 kg/m       Objective:   Physical Exam  Constitutional: He is oriented to person, place, and time. He appears well-developed and well-nourished. No distress.  HENT:  Head: Normocephalic and atraumatic.  Right TM occluded by cerumen  Eyes: EOM are normal. Pupils are equal, round, and reactive to light. No scleral icterus.  Cardiovascular: Normal rate and regular rhythm.   No murmur heard. Pulmonary/Chest: Effort normal and breath sounds normal. No respiratory distress. He has no wheezes. He has no rales.  Abdominal: Soft. Bowel  sounds are normal. He exhibits no distension. There is no tenderness. There is no rebound and no guarding.  Musculoskeletal: He exhibits no edema.  Lymphadenopathy:    He has no cervical adenopathy.  Neurological: He is alert and oriented to person, place, and time.  Skin: Skin is warm and dry.  Psychiatric: He has a normal mood and affect. His behavior is normal. Thought content normal.          Assessment & Plan:  Preventative Care- Discussed modest weight loss (about 10 pounds).  Continue to work on [employer] and exercise.  Immunizations reviewed and up to date. EKG tracing is personally reviewed.  EKG notes NSR.  No acute changes. Will obtain routine lab work.

## 2017-02-19 NOTE — Patient Instructions (Signed)
Please complete lab work prior to leaving. Continue to work on [employer], exercise and weight loss.

## 2017-10-22 DIAGNOSIS — J208 Acute bronchitis due to other specified organisms: Secondary | ICD-10-CM | POA: Diagnosis not present

## 2017-12-28 DIAGNOSIS — J029 Acute pharyngitis, unspecified: Secondary | ICD-10-CM | POA: Diagnosis not present

## 2018-01-01 ENCOUNTER — Encounter: Payer: Self-pay | Admitting: Family

## 2018-01-01 ENCOUNTER — Ambulatory Visit: Payer: BLUE CROSS/BLUE SHIELD | Admitting: Family

## 2018-01-01 VITALS — BP 112/69 | HR 92 | Temp 98.6°F | Resp 16 | Ht 71.0 in | Wt 182.0 lb

## 2018-01-01 DIAGNOSIS — J029 Acute pharyngitis, unspecified: Secondary | ICD-10-CM

## 2018-01-01 MED ORDER — LIDOCAINE VISCOUS 2 % MT SOLN: OROMUCOSAL | 0 refills | Status: DC

## 2018-01-01 NOTE — Patient Instructions (Signed)
Continue augmentin. Begin viscous lidocaine gargle and spit every 3-4 hrs as needed. You may continue ibuprofen as needed as well for pain. Call if new/worsening symptoms or if not improved in 3 days.

## 2018-01-01 NOTE — Progress Notes (Signed)
Subjective:    Patient ID: Larry Brooks, male    DOB: [birth year], 42 y.o.   MRN: [id number]  HPI  Patient is a 42 yr old male who presents today with chief complaint of sore throat. Reports that he was seen on 12/28/17 at Urgent care.  Reports that he was given augmentin for possible strep throat but was not swabbed. Reports resolution of fever and chills. Notes neck feels sore.  Ibuprofen helps. Using nasal wash once a day and has been running a humidifier.  Notes periodic cough.     Review of Systems See HPI  Past Medical History:  Diagnosis Date  . Childhood asthma   . Generalized anxiety disorder   . Leukopenia   . Thrombocytopenia ([place])      Social History   Socioeconomic History  . Marital status: Married    Spouse name: Not on file  . Number of children: Not on file  . Years of education: Not on file  . Highest education level: Not on file  Social Needs  . Financial resource strain: Not on file  . Food insecurity - worry: Not on file  . Food insecurity - inability: Not on file  . Transportation needs - medical: Not on file  . Transportation needs - non-medical: Not on file  Occupational History  . Not on file  Tobacco Use  . Smoking status: Never Smoker  . Smokeless tobacco: Never Used  . Tobacco comment: never used tobacco  Substance and Sexual Activity  . Alcohol use: Yes    Comment: very rare-social  . Drug use: No  . Sexual activity: Not on file  Other Topics Concern  . 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 TONSILLECTOMY    . WISDOM TOOTH EXTRACTION      Family History  Problem Relation Age of Onset  . Coronary artery disease Father        status post 4 stents   . Cancer Father        prostate  . Diabetes type II Unknown        grandfather  .  Hyperlipidemia Unknown        grandfather  . Colon cancer Neg Hx     No Known Allergies  Current Outpatient Medications on File Prior to Visit  Medication Sig Dispense Refill  . amoxicillin-clavulanate (AUGMENTIN) 875-125 MG tablet Take by mouth.    . cetirizine (ZYRTEC) 10 MG tablet Take by mouth.     No current facility-administered medications on file prior to visit.     BP 112/69 (BP Location: Left Arm, Patient Position: Sitting, Cuff Size: Large)   Pulse 92   Temp 98.6 F (37 C) (Oral)   Resp 16   Ht 5' 11"  (1.803 m)   Wt 182 lb (82.6 kg)   SpO2 99%   BMI 25.38 kg/m       Objective:   Physical Exam  Constitutional: He is oriented to person, place, and time. He appears well-developed and well-nourished. No distress.  HENT:  Head: Normocephalic and atraumatic.  Left Ear: Tympanic membrane and ear canal normal.  Mouth/Throat: Posterior oropharyngeal erythema present. No oropharyngeal exudate, posterior oropharyngeal edema or tonsillar abscesses.  R TM occluded by cerumen  Shallow ulcerated lesion noted  left posterior oropharynx.   Neck: Neck supple.  Cardiovascular: Normal rate and regular rhythm.  No murmur heard. Pulmonary/Chest: Effort normal and breath sounds normal. No respiratory distress. He has no wheezes. He has no rales.  Musculoskeletal: He exhibits no edema.  Lymphadenopathy:    He has cervical adenopathy.  Neurological: He is alert and oriented to person, place, and time.  Skin: Skin is warm and dry.  Psychiatric: He has a normal mood and affect. His behavior is normal. Thought content normal.          Assessment & Plan:  Pharyngitis- I suspect viral etiology but will have him continue augmentin since it is too late to swab him for strep.  Advised pt as follows:   Begin viscous lidocaine gargle and spit every 3-4 hrs as needed. You may continue ibuprofen as needed as well for pain. Call if new/worsening symptoms or if not improved in 3 days.

## 2018-01-11 ENCOUNTER — Encounter: Payer: Self-pay | Admitting: Family

## 2018-01-11 ENCOUNTER — Ambulatory Visit (INDEPENDENT_AMBULATORY_CARE_PROVIDER_SITE_OTHER): Payer: BLUE CROSS/BLUE SHIELD | Admitting: Family

## 2018-01-11 VITALS — BP 119/80 | HR 68 | Temp 97.9°F | Resp 16 | Ht 71.0 in | Wt 178.8 lb

## 2018-01-11 DIAGNOSIS — H60501 Unspecified acute noninfective otitis externa, right ear: Secondary | ICD-10-CM | POA: Diagnosis not present

## 2018-01-11 MED ORDER — NEOMYCIN-POLYMYXIN-HC 1 % OT SOLN: 3.0000 [drp] | Freq: Four times a day (QID) | OTIC | 0 refills | Status: AC

## 2018-01-11 NOTE — Progress Notes (Signed)
Subjective:    Patient ID: Larry Brooks, male    DOB: [birth year], 42 y.o.   MRN: [id number]  HPI  Pt present today with c/o right sided ear pain.  Started 01/08/18.  Pain like a "twinge."  Denies fever.  Sore throat is resolved. Denies cough, some mild nasal congestion. Completed augmentin.   Review of Systems    see HPI  Past Medical History:  Diagnosis Date  . Childhood asthma   . Generalized anxiety disorder   . Leukopenia   . Thrombocytopenia ([place])      Social History   Socioeconomic History  . Marital status: Married    Spouse name: Not on file  . Number of children: Not on file  . Years of education: Not on file  . Highest education level: Not on file  Social Needs  . Financial resource strain: Not on file  . Food insecurity - worry: Not on file  . Food insecurity - inability: Not on file  . Transportation needs - medical: Not on file  . Transportation needs - non-medical: Not on file  Occupational History  . Not on file  Tobacco Use  . Smoking status: Never Smoker  . Smokeless tobacco: Never Used  . Tobacco comment: never used tobacco  Substance and Sexual Activity  . Alcohol use: Yes    Comment: very rare-social  . Drug use: No  . Sexual activity: Not on file  Other Topics Concern  . 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 TONSILLECTOMY    . WISDOM TOOTH EXTRACTION      Family History  Problem Relation Age of Onset  . Coronary artery disease Father        status post 4 stents   . Cancer Father        prostate  . Diabetes type II Unknown        grandfather  . Hyperlipidemia Unknown        grandfather  . Colon cancer Neg Hx     No Known Allergies  Current Outpatient Medications on File Prior to Visit  Medication Sig Dispense Refill  . cetirizine  (ZYRTEC) 10 MG tablet Take by mouth.    . lidocaine (XYLOCAINE) 2 % solution Viscous lidocain 20 ml gargle and spit every 3-4 hrs as needed for pain. 400 mL 0   No current facility-administered medications on file prior to visit.     BP 119/80 (BP Location: Left Arm, Patient Position: Sitting, Cuff Size: Normal)   Pulse 68   Temp 97.9 F (36.6 C) (Oral)   Resp 16   Ht 5' 11"  (1.803 m)   Wt 178 lb 12.8 oz (81.1 kg)   SpO2 100%   BMI 24.94 kg/m    Objective:   Physical Exam  Constitutional: He is oriented to person, place, and time. He appears well-developed and well-nourished. No distress.  HENT:  Head: Normocephalic and atraumatic.  Left Ear: Tympanic membrane and ear canal normal.  Mouth/Throat: Oropharynx is clear and moist. No oropharyngeal exudate.  R TM occluded by cerumen.  R canal is erythematous  Cardiovascular: Normal rate and regular rhythm.  No murmur heard. Pulmonary/Chest: Effort normal and breath sounds normal. No respiratory distress. He has no wheezes. He has no rales.  Musculoskeletal: He exhibits no edema.  Neurological: He is alert and oriented to person, place, and time.  Skin: Skin is warm and dry.  Psychiatric: He has a normal mood and affect. His behavior is normal. Thought content normal.          Assessment & Plan:  Right otitis externa-  Attempted to remove cerumen with irrigation but was unsuccessful.  Advised pt to begin cortisporin otic drops, flonase.  Call if symptoms worsen or fail to improve in the next 3 days.  I am unable to visualize R TM but clinically doubt OM given recent augmentin course.

## 2018-01-11 NOTE — Patient Instructions (Signed)
Add flonase 2 sprays each nostril once daily. Begin cortisporin eye drops 3 drops 4 times daily. Let me know if your ear pain worsens or if not improved in 3 days.

## 2018-02-20 ENCOUNTER — Encounter: Payer: Self-pay | Admitting: Family

## 2018-02-20 ENCOUNTER — Ambulatory Visit (INDEPENDENT_AMBULATORY_CARE_PROVIDER_SITE_OTHER): Payer: BLUE CROSS/BLUE SHIELD | Admitting: Family

## 2018-02-20 ENCOUNTER — Telehealth: Payer: Self-pay | Admitting: Family

## 2018-02-20 VITALS — BP 113/71 | HR 73 | Temp 97.8°F | Resp 18 | Ht 71.0 in | Wt 179.4 lb

## 2018-02-20 DIAGNOSIS — Z0000 Encounter for general adult medical examination without abnormal findings: Secondary | ICD-10-CM

## 2018-02-20 DIAGNOSIS — Z8249 Family history of ischemic heart disease and other diseases of the circulatory system: Secondary | ICD-10-CM

## 2018-02-20 LAB — URINALYSIS, ROUTINE W REFLEX MICROSCOPIC
BILIRUBIN URINE: NEGATIVE
HGB URINE DIPSTICK: NEGATIVE
Ketones, ur: NEGATIVE
LEUKOCYTES UA: NEGATIVE
NITRITE: NEGATIVE
Specific Gravity, Urine: 1.02 (ref 1.000–1.030)
TOTAL PROTEIN, URINE-UPE24: NEGATIVE
Urine Glucose: NEGATIVE
Urobilinogen, UA: 0.2 (ref 0.0–1.0)
pH: 6 (ref 5.0–8.0)

## 2018-02-20 NOTE — Telephone Encounter (Signed)
Attempted to reach pt. Left detailed message to please send me a mychart message describing what he is needing as I did not understand the message that was taken when he called.  Awaiting mychart response.

## 2018-02-20 NOTE — Progress Notes (Signed)
Subjective:    Patient ID: Larry Brooks, male    DOB: February 18, 1976, 42 y.o.   MRN: [id number]  HPI  Patient presents today for complete physical.  Immunizations: tdap 2010 Diet:  Reports healthy diet, has improved Exercise:  Reports that he is doing elliptical 30 minutes 3 days a week.  Weights several times a week. Vision: up to date Dental:  Up to date    Review of Systems  Constitutional: Negative for unexpected weight change.  HENT: Positive for rhinorrhea.   Respiratory: Negative for cough.   Cardiovascular: Negative for leg swelling.  Gastrointestinal: Negative for blood in stool, constipation and diarrhea.  Genitourinary: Negative for dysuria, frequency and hematuria.  Musculoskeletal: Negative for arthralgias and myalgias.  Skin: Negative for rash.  Neurological: Negative for headaches.  Hematological: Negative for adenopathy.  Psychiatric/Behavioral:       Denies depression/anxiety   Past Medical History:  Diagnosis Date  . Childhood asthma   . Generalized anxiety disorder   . Leukopenia   . Thrombocytopenia ([place])      Social History   Socioeconomic History  . Marital status: Married    Spouse name: Not on file  . Number of children: Not on file  . Years of education: Not on file  . Highest education level: Not on file  Occupational History  . Not on file  Social Needs  . Financial resource strain: Not on file  . Food insecurity:    Worry: Not on file    Inability: Not on file  . Transportation needs:    Medical: Not on file    Non-medical: Not on file  Tobacco Use  . Smoking status: Never Smoker  . Smokeless tobacco: Never Used  . Tobacco comment: never used tobacco  Substance and Sexual Activity  . Alcohol use: Yes    Comment: very rare-social  . Drug use: No  . Sexual activity: Not on file  Lifestyle  . Physical activity:    Days per week: Not on file    Minutes per session: Not on file  . Stress: Not on file  Relationships  .  Social connections:    Talks on phone: Not on file    Gets together: Not on file    Attends religious service: Not on file    Active member of club or organization: Not on file    Attends meetings of clubs or organizations: Not on file    Relationship status: Not on file  . Intimate partner violence:    Fear of current or ex partner: Not on file    Emotionally abused: Not on file    Physically abused: Not on file    Forced sexual activity: Not on file  Other Topics Concern  . 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 TONSILLECTOMY    . WISDOM TOOTH EXTRACTION      Family History  Problem Relation Age of Onset  . Coronary artery disease Father        status post 4 stents   . Cancer Father        prostate  . Diabetes type II Unknown        grandfather  . Hyperlipidemia Unknown        grandfather  .  Colon cancer Neg Hx     No Known Allergies  Current Outpatient Medications on File Prior to Visit  Medication Sig Dispense Refill  . Probiotic Product (PROBIOTIC ADVANCED PO) Take 3 capsules by mouth daily.    . cetirizine (ZYRTEC) 10 MG tablet Take by mouth.     No current facility-administered medications on file prior to visit.     BP 113/71 (BP Location: Left Arm, Cuff Size: Normal)   Pulse 73   Temp 97.8 F (36.6 C) (Oral)   Resp 18   Ht 5' 11"  (1.803 m)   Wt 179 lb 6.4 oz (81.4 kg)   SpO2 100%   BMI 25.02 kg/m       Objective:   Physical Exam  Physical Exam  Constitutional: He is oriented to person, place, and time. He appears well-developed and well-nourished. No distress.  HENT:  Head: Normocephalic and atraumatic.  Right Ear: Tympanic membrane and ear canal normal.  Left Ear: Tympanic membrane and ear canal normal.  Mouth/Throat: Oropharynx is clear and moist.    Eyes: Pupils are equal, round, and reactive to light. No scleral icterus.  Neck: Normal range of motion. No thyromegaly present.  Cardiovascular: Normal rate and regular rhythm.   No murmur heard. Pulmonary/Chest: Effort normal and breath sounds normal. No respiratory distress. He has no wheezes. He has no rales. He exhibits no tenderness.  Abdominal: Soft. Bowel sounds are normal. He exhibits no distension and no mass. There is no tenderness. There is no rebound and no guarding.  Musculoskeletal: He exhibits no edema.  Lymphadenopathy:    He has no cervical adenopathy.  Neurological: He is alert and oriented to person, place, and time. He has normal patellar reflexes. He exhibits normal muscle tone. Coordination normal.  Skin: Skin is warm and dry. Some scattered petechia noted on trunk/arms Psychiatric: He has a normal mood and affect. His behavior is normal. Judgment and thought content normal.           Assessment & Plan:   Preventative care- discussed continuing healthy diet and regular exercise. EKG tracing is personally reviewed.  EKG notes NSR.  No acute changes.  Obtain routine lab work.      Assessment & Plan:

## 2018-02-20 NOTE — Addendum Note (Signed)
Addended by: Harl Bowie on: 02/20/2018 03:11 PM   Modules accepted: Orders

## 2018-02-20 NOTE — Telephone Encounter (Signed)
Copied from [place] [id number]. Topic: Quick Communication - See Telephone Encounter >> Feb 20, 2018  1:56 PM Burnis Medin, NT wrote: CRM for notification. See Telephone encounter for: 02/20/18. Patient called and wanted to see if someone could call him back regarding information about getting a calcium issue.

## 2018-02-20 NOTE — Patient Instructions (Addendum)
Please complete lab work prior to leaving.    Preventive Care 40-64 Years, Male Preventive care refers to lifestyle choices and visits with your health care provider that can promote health and wellness. What does preventive care include?  A yearly physical exam. This is also called an annual well check.  Dental exams once or twice a year.  Routine eye exams. Ask your health care provider how often you should have your eyes checked.  Personal lifestyle choices, including: ? Daily care of your teeth and gums. ? Regular physical activity. ? Eating a healthy diet. ? Avoiding tobacco and drug use. ? Limiting alcohol use. ? Practicing safe sex. ? Taking low-dose aspirin every day starting at age 51. What happens during an annual well check? The services and screenings done by your health care provider during your annual well check will depend on your age, overall health, lifestyle risk factors, and family history of disease. Counseling Your health care provider may ask you questions about your:  Alcohol use.  Tobacco use.  Drug use.  Emotional well-being.  Home and relationship well-being.  Sexual activity.  Eating habits.  Work and work Statistician.  Screening You may have the following tests or measurements:  Height, weight, and BMI.  Blood pressure.  Lipid and cholesterol levels. These may be checked every 5 years, or more frequently if you are over 104 years old.  Skin check.  Lung cancer screening. You may have this screening every year starting at age 42 if you have a 30-pack-year history of smoking and currently smoke or have quit within the past 15 years.  Fecal occult blood test (FOBT) of the stool. You may have this test every year starting at age 59.  Flexible sigmoidoscopy or colonoscopy. You may have a sigmoidoscopy every 5 years or a colonoscopy every 10 years starting at age 63.  Prostate cancer screening. Recommendations will vary depending on your  family history and other risks.  Hepatitis C blood test.  Hepatitis B blood test.  Sexually transmitted disease (STD) testing.  Diabetes screening. This is done by checking your blood sugar (glucose) after you have not eaten for a while (fasting). You may have this done every 1-3 years.  Discuss your test results, treatment options, and if necessary, the need for more tests with your health care provider. Vaccines Your health care provider may recommend certain vaccines, such as:  Influenza vaccine. This is recommended every year.  Tetanus, diphtheria, and acellular pertussis (Tdap, Td) vaccine. You may need a Td booster every 10 years.  Varicella vaccine. You may need this if you have not been vaccinated.  Zoster vaccine. You may need this after age 29.  Measles, mumps, and rubella (MMR) vaccine. You may need at least one dose of MMR if you were born in 1957 or later. You may also need a second dose.  Pneumococcal 13-valent conjugate (PCV13) vaccine. You may need this if you have certain conditions and have not been vaccinated.  Pneumococcal polysaccharide (PPSV23) vaccine. You may need one or two doses if you smoke cigarettes or if you have certain conditions.  Meningococcal vaccine. You may need this if you have certain conditions.  Hepatitis A vaccine. You may need this if you have certain conditions or if you travel or work in places where you may be exposed to hepatitis A.  Hepatitis B vaccine. You may need this if you have certain conditions or if you travel or work in places where you may be exposed  to hepatitis B.  Haemophilus influenzae type b (Hib) vaccine. You may need this if you have certain risk factors.  Talk to your health care provider about which screenings and vaccines you need and how often you need them. This information is not intended to replace advice given to you by your health care provider. Make sure you discuss any questions you have with your health  care provider. Document Released: 12/10/2015 Document Revised: 08/02/2016 Document Reviewed: 09/14/2015 Elsevier Interactive Patient Education  [employer].

## 2018-02-21 ENCOUNTER — Other Ambulatory Visit (INDEPENDENT_AMBULATORY_CARE_PROVIDER_SITE_OTHER): Payer: BLUE CROSS/BLUE SHIELD

## 2018-02-21 DIAGNOSIS — Z0000 Encounter for general adult medical examination without abnormal findings: Secondary | ICD-10-CM

## 2018-02-21 LAB — BASIC METABOLIC PANEL
BUN: 16 mg/dL (ref 6–23)
CALCIUM: 9 mg/dL (ref 8.4–10.5)
CO2: 28 meq/L (ref 19–32)
Chloride: 105 mEq/L (ref 96–112)
Creatinine, Ser: 1.09 mg/dL (ref 0.40–1.50)
GFR: 79.05 mL/min (ref >60.00)
GLUCOSE: 96 mg/dL (ref 70–99)
Potassium: 4.2 mEq/L (ref 3.5–5.1)
Sodium: 140 mEq/L (ref 135–145)

## 2018-02-21 LAB — CBC WITH DIFFERENTIAL/PLATELET
BASOS ABS: 0 10*3/uL (ref 0.0–0.1)
Basophils Relative: 0.4 % (ref 0.0–3.0)
Eosinophils Absolute: 0.1 10*3/uL (ref 0.0–0.7)
Eosinophils Relative: 2.4 % (ref 0.0–5.0)
HEMATOCRIT: 42 % (ref 39.0–52.0)
HEMOGLOBIN: 15 g/dL (ref 13.0–17.0)
LYMPHS PCT: 28.4 % (ref 12.0–46.0)
Lymphs Abs: 1 10*3/uL (ref 0.7–4.0)
MCHC: 35.6 g/dL (ref 30.0–36.0)
MCV: 89.8 fl (ref 78.0–100.0)
MONOS PCT: 9 % (ref 3.0–12.0)
Monocytes Absolute: 0.3 10*3/uL (ref 0.1–1.0)
Neutro Abs: 2.1 10*3/uL (ref 1.4–7.7)
Neutrophils Relative %: 59.8 % (ref 43.0–77.0)
Platelets: 146 10*3/uL — ABNORMAL LOW (ref 150.0–400.0)
RBC: 4.68 Mil/uL (ref 4.22–5.81)
RDW: 13.6 % (ref 11.5–15.5)
WBC: 3.5 10*3/uL — AB (ref 4.0–10.5)

## 2018-02-21 LAB — LIPID PANEL
CHOL/HDL RATIO: 3
Cholesterol: 102 mg/dL (ref 0–200)
HDL: 33 mg/dL — ABNORMAL LOW (ref >39.00)
LDL Cholesterol: 61 mg/dL (ref 0–99)
NONHDL: 69.28
Triglycerides: 43 mg/dL (ref 0.0–149.0)
VLDL: 8.6 mg/dL (ref 0.0–40.0)

## 2018-02-21 LAB — TSH: TSH: 3.71 u[IU]/mL (ref 0.35–4.50)

## 2018-02-21 LAB — HEPATIC FUNCTION PANEL
ALBUMIN: 3.9 g/dL (ref 3.5–5.2)
ALK PHOS: 81 U/L (ref 39–117)
ALT: 17 U/L (ref 0–53)
AST: 18 U/L (ref 0–37)
Bilirubin, Direct: 0.3 mg/dL (ref 0.0–0.3)
Total Bilirubin: 1.1 mg/dL (ref 0.2–1.2)
Total Protein: 7 g/dL (ref 6.0–8.3)

## 2018-02-21 NOTE — Telephone Encounter (Signed)
See pt e-mail

## 2018-02-25 NOTE — Progress Notes (Signed)
Cardiology Office Note:    Date:  02/26/2018   ID:  Larry Brooks, DOB 1976/09/08, MRN [id number]  PCP:  Debbrah Alar, NP  Cardiologist:  Shirlee More, MD   Referring MD: Debbrah Alar, NP  ASSESSMENT:    1. Cardiac risk counseling   2. Family history of heart attack   3. Low serum HDL    PLAN:    In order of problems listed above:  1. his traditional risk factors are noteworthy only for family history of premature CAD and low HDL.  At this point I would not advise lipid-lowering therapy with statin or an ischemia evaluation.  To further refine his cardiovascular risk he is interested and will have a cardiac CT calcium score.  If low risk I would continue his current regimen of activity and monitor his lipid profile and response.  Presently there are no cardiovascular drugs that are effective in  raising HDL and decreasing cardiovascular risk.  A CTEP inhibitor may be on the market in the next 2 years.  If his calcium score is high he would benefit from further evaluation such as a cardiac CTA and if intermediate I would in this case start low-dose aspirin and a low-dose of a low intensity statin.  If his CT calcium score is anything below I will contact him to come back to my office 2. He will continue his current lifestyle modification including regular activity and acute CT calcium score to further refine risk. 3. Lipids are otherwise ideal he will continue his regular exercise activity pattern and potentially be a candidate for HDL raising therapy in the future.  Next appointment as needed, if his calcium score is severely elevated I will bring him back to my office for further ischemia evaluation   Medication Adjustments/Labs and Tests Ordered: Current medicines are reviewed at length with the patient today.  Concerns regarding medicines are outlined above.  Orders Placed This Encounter  Procedures  . CT HEART W/O CONTRAST MEDIA - QUANT EVAL CORONARY CALCIUM   No  orders of the defined types were placed in this encounter.    Chief Complaint  Patient presents with  . New Patient (Initial Visit)    per Debbrah Alar, NP.  Pt has family hx of CAD    History of Present Illness:    Larry Brooks is a 42 y.o. male who is being seen today for the evaluation of cardiovascular risk at the request of Debbrah Alar, NP. He is concerned regarding his cardiovascular risk with his father and paternal grandfather having CAD in their 29s.  Distribution risk factors are noteworthy only for family history and low HDL.  His lipids are otherwise ideal he follows a good lifestyle exercises regularly and has had no chest pain dyspnea palpitation or syncope and no known atherosclerosis.  He is a high healthcare C and is interested in the CT calcium score to redefine his cardiovascular risk.  He was given Neurosurgeon  Past Medical History:  Diagnosis Date  . Childhood asthma   . Generalized anxiety disorder   . Leukopenia   . Thrombocytopenia ([place])     Past Surgical History:  Procedure Laterality Date  . TONSILLECTOMY    . WISDOM TOOTH EXTRACTION      Current Medications: Current Meds  Medication Sig  . cetirizine (ZYRTEC) 10 MG tablet Take 10 mg by mouth daily as needed for allergies.   . Probiotic Product (PROBIOTIC ADVANCED PO) Take 3 capsules by mouth daily.  Allergies:   Patient has no known allergies.   Social History   Socioeconomic History  . Marital status: Married    Spouse name: Not on file  . Number of children: Not on file  . Years of education: Not on file  . Highest education level: Not on file  Occupational History  . Not on file  Social Needs  . Financial resource strain: Not on file  . Food insecurity:    Worry: Not on file    Inability: Not on file  . Transportation needs:    Medical: Not on file    Non-medical: Not on file  Tobacco Use  . Smoking status: Never Smoker  . Smokeless tobacco: Never Used   . Tobacco comment: never used tobacco  Substance and Sexual Activity  . Alcohol use: Yes    Comment: very rare-social  . Drug use: No  . Sexual activity: Not on file  Lifestyle  . Physical activity:    Days per week: Not on file    Minutes per session: Not on file  . Stress: Not on file  Relationships  . Social connections:    Talks on phone: Not on file    Gets together: Not on file    Attends religious service: Not on file    Active member of club or organization: Not on file    Attends meetings of clubs or organizations: Not on file    Relationship status: Not on file  Other Topics Concern  . 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 There is no history of Colon cancer.  ROS:   Review of Systems  Constitution: Negative.  HENT: Negative.   Eyes: Negative.   Cardiovascular: Negative.   Respiratory: Negative.   Endocrine: Negative.   Hematologic/Lymphatic: Negative.   Skin: Negative.   Musculoskeletal: Negative.   Gastrointestinal: Negative.   Genitourinary: Negative.   Neurological: Negative.   Psychiatric/Behavioral: Negative.   Allergic/Immunologic: Negative.    Please see the history of present illness.     All other systems reviewed and are negative.  EKGs/Labs/Other Studies Reviewed:    The following studies were reviewed today:   EKG:  EKG 02/20/2018 sinus rhythm normal  Recent Labs: 02/21/2018: ALT 17; BUN 16; Creatinine, Ser 1.09; Hemoglobin 15.0; Platelets 146.0; Potassium 4.2; Sodium 140; TSH 3.71  Recent Lipid Panel    Component Value  Date/Time   CHOL 102 02/21/2018 0709   TRIG 43.0 02/21/2018 0709   HDL 33.00 (L) 02/21/2018 0709   CHOLHDL 3 02/21/2018 0709   VLDL 8.6 02/21/2018 0709   LDLCALC 61 02/21/2018 0709    Physical Exam:    VS:  BP 118/72 (BP Location: Left Arm, Patient Position: Sitting, Cuff Size: Normal)   Pulse 72   Ht 5' 11"  (1.803 m)   Wt 180 lb (81.6 kg)   SpO2 99%   BMI 25.10 kg/m     Wt Readings from Last 3 Encounters:  02/26/18 180 lb (81.6 kg)  02/20/18 179 lb 6.4 oz (81.4 kg)  01/11/18 178 lb 12.8 oz (81.1  kg)     GEN: He has no xanthoma xanthelasma appears healthy well nourished, well developed in no acute distress HEENT: Normal NECK: No JVD; No carotid bruits LYMPHATICS: No lymphadenopathy CARDIAC: RRR, no murmurs, rubs, gallops RESPIRATORY:  Clear to auscultation without rales, wheezing or rhonchi  ABDOMEN: Soft, non-tender, non-distended MUSCULOSKELETAL:  No edema; No deformity  SKIN: Warm and dry NEUROLOGIC:  Alert and oriented x 3 PSYCHIATRIC:  Normal affect     Signed, Shirlee More, MD  02/26/2018 10:42 AM    [place]

## 2018-02-26 ENCOUNTER — Ambulatory Visit: Payer: BLUE CROSS/BLUE SHIELD | Admitting: Cardiology

## 2018-02-26 ENCOUNTER — Encounter: Payer: Self-pay | Admitting: Cardiology

## 2018-02-26 VITALS — BP 118/72 | HR 72 | Ht 71.0 in | Wt 180.0 lb

## 2018-02-26 DIAGNOSIS — R748 Abnormal levels of other serum enzymes: Secondary | ICD-10-CM

## 2018-02-26 DIAGNOSIS — Z8249 Family history of ischemic heart disease and other diseases of the circulatory system: Secondary | ICD-10-CM

## 2018-02-26 DIAGNOSIS — Z7189 Other specified counseling: Secondary | ICD-10-CM

## 2018-02-26 NOTE — Patient Instructions (Addendum)
Medication Instructions:  Your physician recommends that you continue on your current medications as directed. Please refer to the Current Medication list given to you today.  Labwork: None  Testing/Procedures: Non-Cardiac CT scanning, (CAT scanning), is a noninvasive, special x-ray that produces cross-sectional images of the body using x-rays and a computer. CT scans help physicians diagnose and treat medical conditions. For some CT exams, a contrast material is used to enhance visibility in the area of the body being studied. CT scans provide greater clarity and reveal more details than regular x-ray exams.  Calcium score.  Follow-Up: Your physician recommends that you schedule a follow-up appointment as needed if symptoms worsen or fail to improve.  Any Other Special Instructions Will Be Listed Below (If Applicable).     If you need a refill on your cardiac medications before your next appointment, please call your pharmacy.

## 2018-03-04 ENCOUNTER — Encounter: Payer: Self-pay | Admitting: Cardiology

## 2018-03-04 DIAGNOSIS — Z7189 Other specified counseling: Secondary | ICD-10-CM

## 2018-03-19 ENCOUNTER — Telehealth: Payer: Self-pay | Admitting: Cardiology

## 2018-03-19 NOTE — Telephone Encounter (Signed)
Larry Brooks returned your call

## 2018-03-19 NOTE — Telephone Encounter (Signed)
Left message for patient to return call to office. 

## 2018-03-19 NOTE — Telephone Encounter (Signed)
Wants his calcium test scheduled

## 2018-03-20 NOTE — Telephone Encounter (Signed)
Spoke with patient and he is aware that someone will be contacting him to get CT calcium scheduled.  Patient verbalized understanding.

## 2018-04-04 ENCOUNTER — Ambulatory Visit (INDEPENDENT_AMBULATORY_CARE_PROVIDER_SITE_OTHER)
Admission: RE | Admit: 2018-04-04 | Discharge: 2018-04-04 | Disposition: A | Discharge status: Home / Self Care | Payer: Self-pay | Source: Ambulatory Visit | Attending: Cardiology | Admitting: Cardiology

## 2018-04-04 DIAGNOSIS — Z7189 Other specified counseling: Secondary | ICD-10-CM

## 2018-04-05 ENCOUNTER — Telehealth: Payer: Self-pay

## 2018-04-05 NOTE — Telephone Encounter (Signed)
Patient notified of normal CT Calcium score test per Dr Bettina Gavia.  Patient verbalized understanding.

## 2018-04-05 NOTE — Telephone Encounter (Signed)
Left message for patient to return call for results of CT Cardiac Score.

## 2018-11-20 DIAGNOSIS — L299 Pruritus, unspecified: Secondary | ICD-10-CM | POA: Diagnosis not present

## 2018-11-20 DIAGNOSIS — J029 Acute pharyngitis, unspecified: Secondary | ICD-10-CM | POA: Diagnosis not present

## 2018-11-20 DIAGNOSIS — R0982 Postnasal drip: Secondary | ICD-10-CM | POA: Diagnosis not present

## 2018-11-20 DIAGNOSIS — R05 Cough: Secondary | ICD-10-CM | POA: Diagnosis not present

## 2019-02-24 ENCOUNTER — Encounter: Payer: BLUE CROSS/BLUE SHIELD | Admitting: Family

## 2019-03-25 IMAGING — CT CT HEART SCORING
2 series · 16 of 20 positions shown, 18 images · non-contrast
Comparison: None.

EXAM:
OVER-READ INTERPRETATION  CT CHEST

The following report is an over-read performed by radiologist Dr.
over-read does not include interpretation of cardiac or coronary
anatomy or pathology. The coronary calcium interpretation by the
cardiologist is attached.
CLINICAL DATA: Risk stratification
Coronary Calcium Score
MEDICATIONS:
None
TECHNIQUE: The patient was scanned on a Siemens Force scanner. Axial
non-contrast 3 mm slices were carried out through the heart. The
data set was analyzed on a dedicated work station and scored using
the Agatson method.

[Series 2: casc 3.0 i36f 2 bestdiast 66 % · axial · 0.36mm/px · z∈[-244,-142]mm · 8 of 44 slices shown, 10 images]
[im 5/44  vessel]
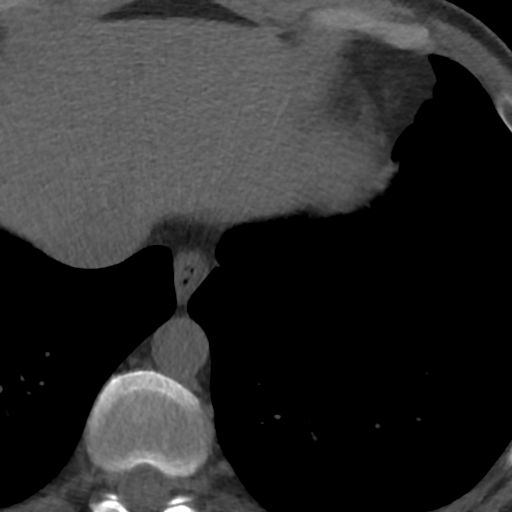
[im 5/44  lung]
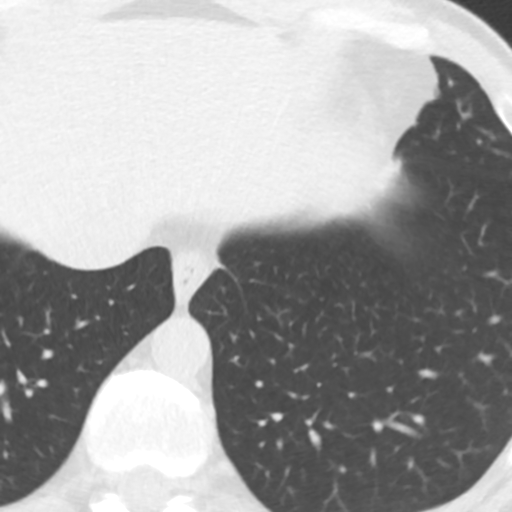
[im 10/44  vessel]
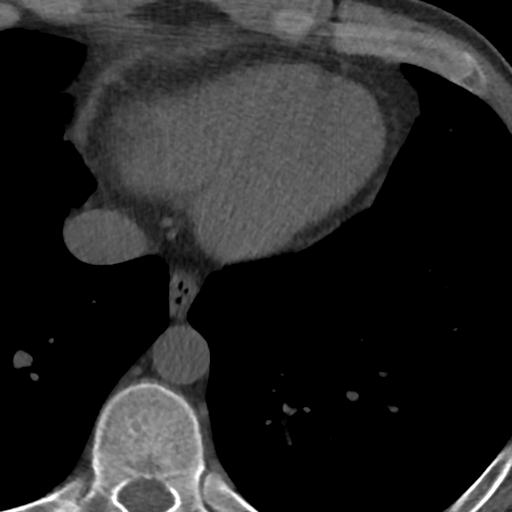
[im 15/44  vessel]
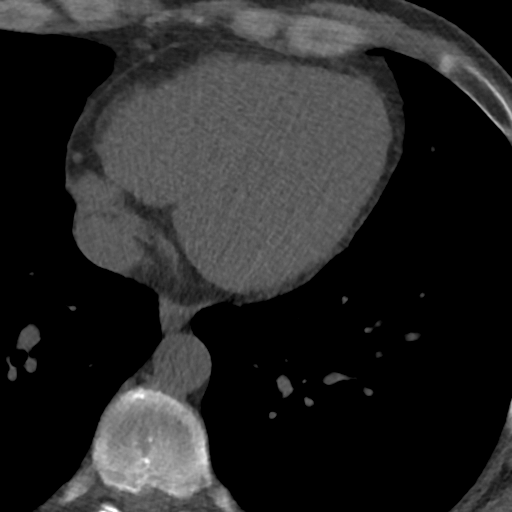
[im 20/44  vessel]
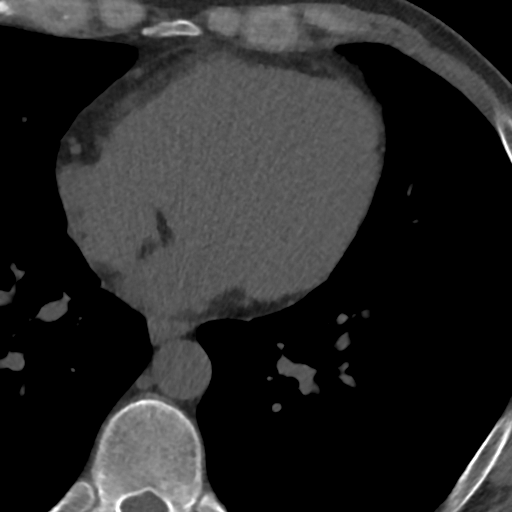
[im 24/44  vessel]
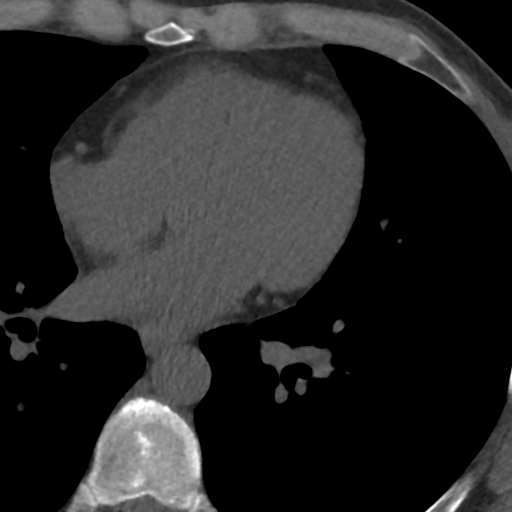
[im 24/44  lung]
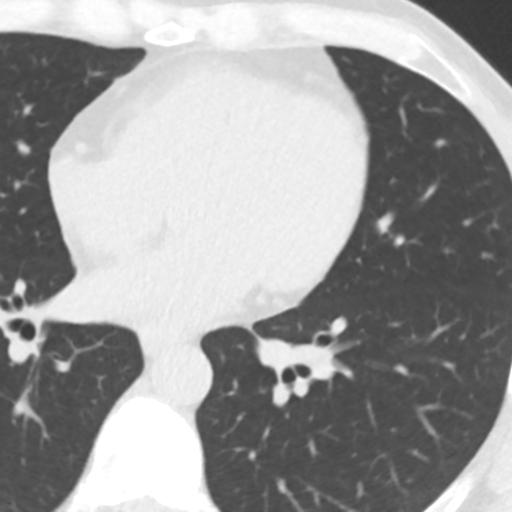
[im 29/44  vessel]
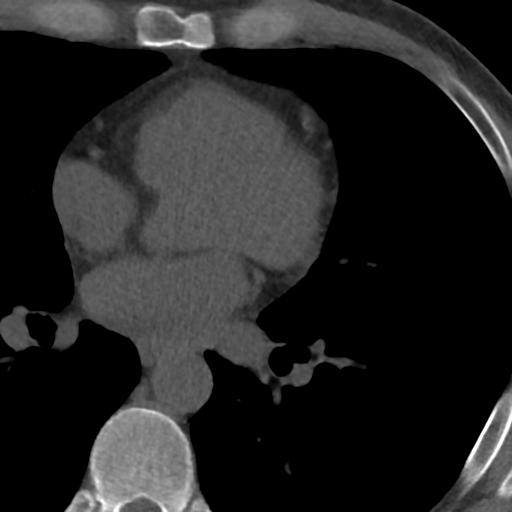
[im 34/44  vessel]
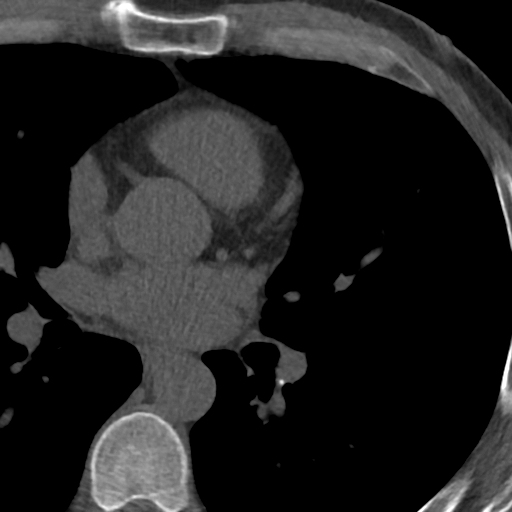
[im 39/44  vessel]
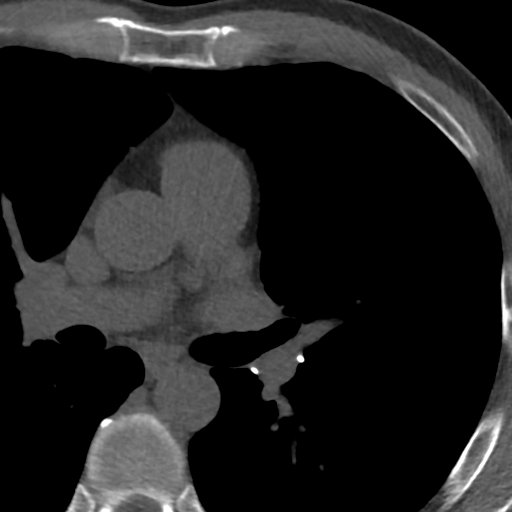

[Series 4: lung st 70 % · axial · 0.66mm/px · z∈[-244,-142]mm · 8 of 44 slices shown]
[im 5/44  lung]
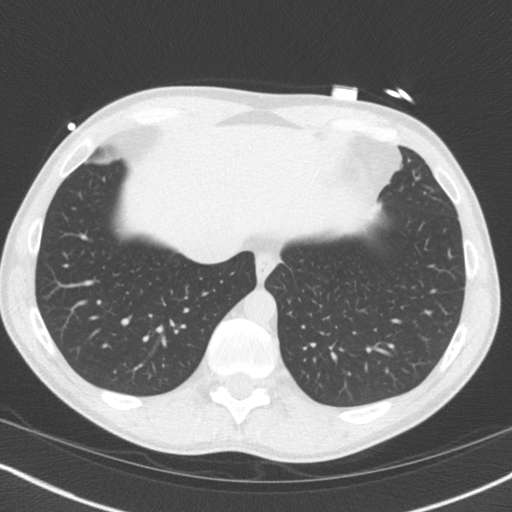
[im 10/44  lung]
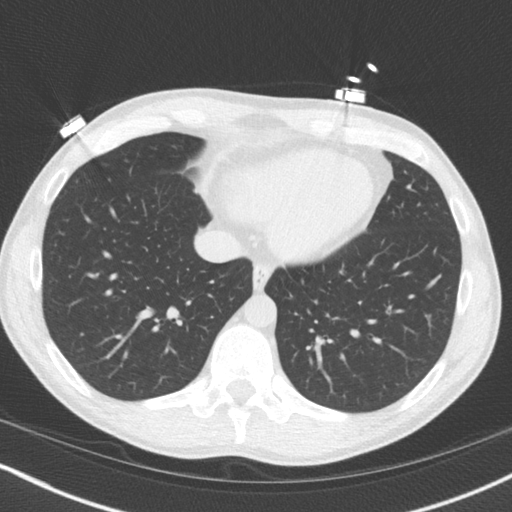
[im 15/44  lung]
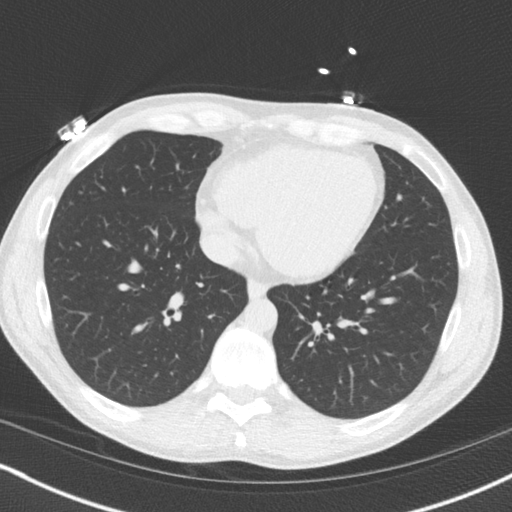
[im 20/44  lung]
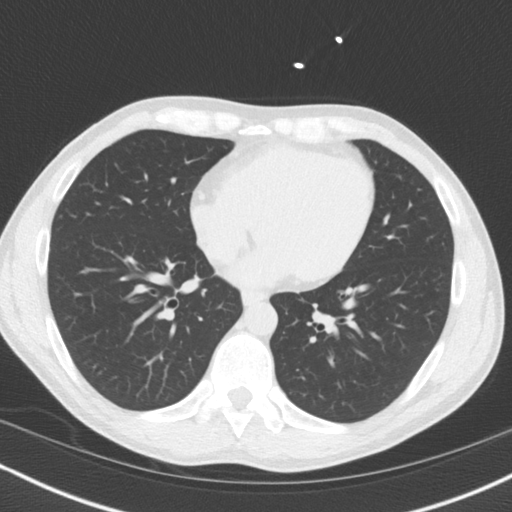
[im 24/44  lung]
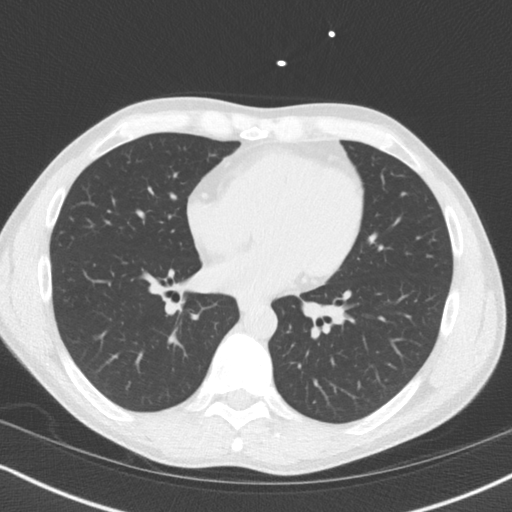
[im 29/44  lung]
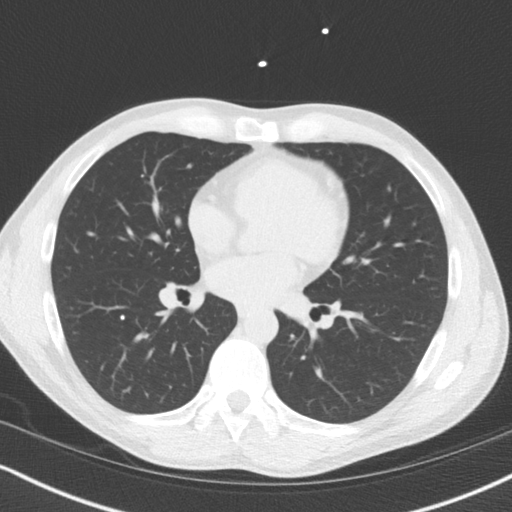
[im 34/44  lung]
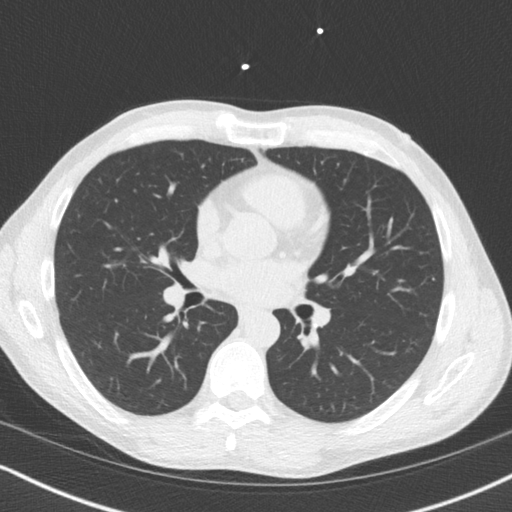
[im 39/44  lung]
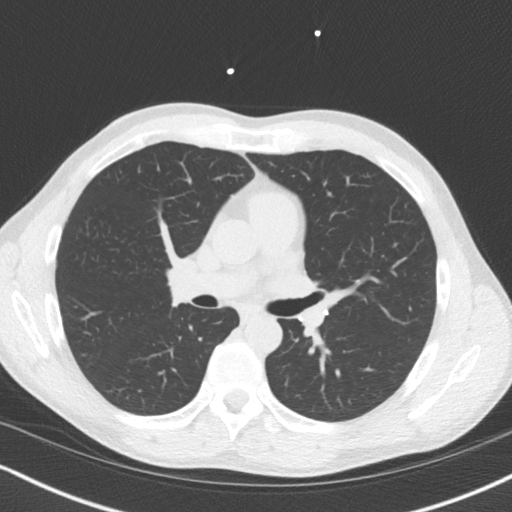

[16 of 20 positions shown; findings below may reference images not displayed]

FINDINGS: Limited view of the lung parenchyma demonstrates no suspicious
nodularity. Airways are normal.

Limited view of the mediastinum demonstrates no adenopathy.
Esophagus normal.

Limited view of the upper abdomen unremarkable.

Limited view of the skeleton and chest wall is unremarkable.
IMPRESSION: No significant extracardiac findings.
FINDINGS: Non-cardiac: See separate report from [REDACTED].

Ascending Aorta: Normal size, no calcifications.

Pericardium: Normal.

Coronary arteries: Normal origin.
IMPRESSION: Coronary calcium score of 0. This was 0 percentile for age and sex
matched control.

## 2019-04-22 ENCOUNTER — Other Ambulatory Visit: Payer: Self-pay

## 2019-04-22 ENCOUNTER — Encounter: Payer: BLUE CROSS/BLUE SHIELD | Admitting: Family

## 2019-04-25 ENCOUNTER — Ambulatory Visit (INDEPENDENT_AMBULATORY_CARE_PROVIDER_SITE_OTHER): Payer: BLUE CROSS/BLUE SHIELD | Admitting: Family

## 2019-04-25 ENCOUNTER — Other Ambulatory Visit: Payer: Self-pay

## 2019-04-25 ENCOUNTER — Encounter: Payer: Self-pay | Admitting: Family

## 2019-04-25 VITALS — BP 110/70 | HR 75 | Temp 97.9°F | Ht 71.0 in | Wt 179.6 lb

## 2019-04-25 DIAGNOSIS — Z23 Encounter for immunization: Secondary | ICD-10-CM

## 2019-04-25 DIAGNOSIS — Z0000 Encounter for general adult medical examination without abnormal findings: Secondary | ICD-10-CM | POA: Diagnosis not present

## 2019-04-25 LAB — HEPATIC FUNCTION PANEL
ALT: 17 U/L (ref 0–53)
AST: 16 U/L (ref 0–37)
Albumin: 4.3 g/dL (ref 3.5–5.2)
Alkaline Phosphatase: 81 U/L (ref 39–117)
Bilirubin, Direct: 0.3 mg/dL (ref 0.0–0.3)
Total Bilirubin: 1.1 mg/dL (ref 0.2–1.2)
Total Protein: 6.6 g/dL (ref 6.0–8.3)

## 2019-04-25 LAB — LIPID PANEL
Cholesterol: 130 mg/dL (ref 0–200)
HDL: 37.1 mg/dL — ABNORMAL LOW (ref >39.00)
LDL Cholesterol: 80 mg/dL (ref 0–99)
NonHDL: 92.85
Total CHOL/HDL Ratio: 4
Triglycerides: 65 mg/dL (ref 0.0–149.0)
VLDL: 13 mg/dL (ref 0.0–40.0)

## 2019-04-25 LAB — CBC WITH DIFFERENTIAL/PLATELET
Basophils Absolute: 0 10*3/uL (ref 0.0–0.1)
Basophils Relative: 0.4 % (ref 0.0–3.0)
Eosinophils Absolute: 0.1 10*3/uL (ref 0.0–0.7)
Eosinophils Relative: 2.3 % (ref 0.0–5.0)
HCT: 45.3 % (ref 39.0–52.0)
Hemoglobin: 16.3 g/dL (ref 13.0–17.0)
Lymphocytes Relative: 28.2 % (ref 12.0–46.0)
Lymphs Abs: 1.1 10*3/uL (ref 0.7–4.0)
MCHC: 36 g/dL (ref 30.0–36.0)
MCV: 90.1 fl (ref 78.0–100.0)
Monocytes Absolute: 0.3 10*3/uL (ref 0.1–1.0)
Monocytes Relative: 8.3 % (ref 3.0–12.0)
Neutro Abs: 2.3 10*3/uL (ref 1.4–7.7)
Neutrophils Relative %: 60.8 % (ref 43.0–77.0)
Platelets: 140 10*3/uL — ABNORMAL LOW (ref 150.0–400.0)
RBC: 5.03 Mil/uL (ref 4.22–5.81)
RDW: 13.3 % (ref 11.5–15.5)
WBC: 3.8 10*3/uL — ABNORMAL LOW (ref 4.0–10.5)

## 2019-04-25 LAB — BASIC METABOLIC PANEL
BUN: 16 mg/dL (ref 6–23)
CO2: 29 mEq/L (ref 19–32)
Calcium: 9.1 mg/dL (ref 8.4–10.5)
Chloride: 102 mEq/L (ref 96–112)
Creatinine, Ser: 1.21 mg/dL (ref 0.40–1.50)
GFR: 65.56 mL/min (ref >60.00)
Glucose, Bld: 82 mg/dL (ref 70–99)
Potassium: 4.1 mEq/L (ref 3.5–5.1)
Sodium: 138 mEq/L (ref 135–145)

## 2019-04-25 LAB — TSH: TSH: 2.98 u[IU]/mL (ref 0.35–4.50)

## 2019-04-25 NOTE — Progress Notes (Signed)
Subjective:    Patient ID: Larry Brooks, male    DOB: 05/06/1976, 43 y.o.   MRN: [id number]  HPI  Patient presents today for complete physical.  Immunizations: tetanus due Diet:  Has ben eating healthy Wt Readings from Last 3 Encounters:  04/25/19 179 lb 9.6 oz (81.5 kg)  02/26/18 180 lb (81.6 kg)  02/20/18 179 lb 6.4 oz (81.4 kg)  Exercise: intermittently Dental: up to date, last visit march Vision: up to date (10/19)   Review of Systems  Constitutional: Negative for unexpected weight change.  HENT: Negative for rhinorrhea.   Eyes: Negative for visual disturbance.  Respiratory: Negative for cough.   Cardiovascular: Negative for leg swelling.  Gastrointestinal: Negative for blood in stool, constipation and diarrhea.  Genitourinary: Negative for difficulty urinating, dysuria, frequency and hematuria.  Musculoskeletal: Negative for arthralgias and myalgias.  Skin: Negative for rash.  Neurological: Negative for headaches.  Hematological: Negative for adenopathy.  Psychiatric/Behavioral:       Denies depression/anxiety   Past Medical History:  Diagnosis Date  . Childhood asthma   . Generalized anxiety disorder   . Leukopenia   . Thrombocytopenia ([place])      Social History   Socioeconomic History  . Marital status: Married    Spouse name: Not on file  . Number of children: Not on file  . Years of education: Not on file  . Highest education level: Not on file  Occupational History  . Not on file  Social Needs  . Financial resource strain: Not on file  . Food insecurity:    Worry: Not on file    Inability: Not on file  . Transportation needs:    Medical: Not on file    Non-medical: Not on file  Tobacco Use  . Smoking status: Never Smoker  . Smokeless tobacco: Never Used  . Tobacco comment: never used tobacco  Substance and Sexual Activity  . Alcohol use: Yes    Comment: very rare-social  . Drug use: No  . Sexual activity: Not on file  Lifestyle  .  Physical activity:    Days per week: Not on file    Minutes per session: Not on file  . Stress: Not on file  Relationships  . Social connections:    Talks on phone: Not on file    Gets together: Not on file    Attends religious service: Not on file    Active member of club or organization: Not on file    Attends meetings of clubs or organizations: Not on file    Relationship status: Not on file  . Intimate partner violence:    Fear of current or ex partner: Not on file    Emotionally abused: Not on file    Physically abused: Not on file    Forced sexual activity: Not on file  Other Topics Concern  . 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 TONSILLECTOMY    . WISDOM TOOTH EXTRACTION      Family History  Problem Relation Age of Onset  . Coronary artery disease Father        status post 4 stents   . Cancer Father        prostate  . Hyperlipidemia Father   .  Diabetes type II Unknown        grandfather  . Hyperlipidemia Unknown        grandfather  . Heart attack Paternal Grandfather   . Hyperlipidemia Paternal Grandfather   . Colon cancer Neg Hx     No Known Allergies  Current Outpatient Medications on File Prior to Visit  Medication Sig Dispense Refill  . cetirizine (ZYRTEC) 10 MG tablet Take 10 mg by mouth daily as needed for allergies.     . Probiotic Product (PROBIOTIC ADVANCED PO) Take 3 capsules by mouth daily.     No current facility-administered medications on file prior to visit.     BP 110/70   Pulse 75   Temp 97.9 F (36.6 C)   Ht 5' 11"  (1.803 m)   Wt 179 lb 9.6 oz (81.5 kg)   SpO2 100%   BMI 25.05 kg/m       Objective:   Physical Exam  Physical Exam  Constitutional: He is oriented to person, place, and time. He appears well-developed and  well-nourished. No distress.  HENT:  Head: Normocephalic and atraumatic.  Right Ear: Tympanic membrane and ear canal normal.  Left Ear: Tympanic membrane and ear canal normal.  Mouth/Throat: Oropharynx is clear and moist.  Eyes: Pupils are equal, round, and reactive to light. No scleral icterus.  Neck: Normal range of motion. No thyromegaly present.  Cardiovascular: Normal rate and regular rhythm.   No murmur heard. Pulmonary/Chest: Effort normal and breath sounds normal. No respiratory distress. He has no wheezes. He has no rales. He exhibits no tenderness.  Abdominal: Soft. Bowel sounds are normal. He exhibits no distension and no mass. There is no tenderness. There is no rebound and no guarding.  Musculoskeletal: He exhibits no edema.  Lymphadenopathy:    He has no cervical adenopathy.  Neurological: He is alert and oriented to person, place, and time. He has normal patellar reflexes. He exhibits normal muscle tone. Coordination normal.  Skin: Skin is warm and dry.  Psychiatric: He has a normal mood and affect. His behavior is normal. Judgment and thought content normal.           Assessment & Plan:   Preventative care- discussed healthy diet, exercise.  Obtain routine labs. Tdap today.       Assessment & Plan:

## 2019-04-25 NOTE — Patient Instructions (Signed)
Continue healthy diet.  Work on getting 30 minutes of exercise 3 days a week. Complete lab work prior to leaving.

## 2019-04-25 NOTE — Addendum Note (Signed)
Addended by: Wynonia Musty A on: 04/25/2019 08:16 AM   Modules accepted: Orders

## 2020-04-27 ENCOUNTER — Other Ambulatory Visit: Payer: Self-pay

## 2020-04-27 ENCOUNTER — Ambulatory Visit (INDEPENDENT_AMBULATORY_CARE_PROVIDER_SITE_OTHER): Payer: BC Managed Care – PPO | Admitting: Family

## 2020-04-27 ENCOUNTER — Encounter: Payer: Self-pay | Admitting: Family

## 2020-04-27 VITALS — BP 109/72 | HR 74 | Temp 97.8°F | Resp 16 | Ht 71.0 in | Wt 182.0 lb

## 2020-04-27 DIAGNOSIS — Z0000 Encounter for general adult medical examination without abnormal findings: Secondary | ICD-10-CM

## 2020-04-27 LAB — CBC WITH DIFFERENTIAL/PLATELET
Basophils Absolute: 0 10*3/uL (ref 0.0–0.1)
Basophils Relative: 0.7 % (ref 0.0–3.0)
Eosinophils Absolute: 0.1 10*3/uL (ref 0.0–0.7)
Eosinophils Relative: 2.9 % (ref 0.0–5.0)
HCT: 43.1 % (ref 39.0–52.0)
Hemoglobin: 15.4 g/dL (ref 13.0–17.0)
Lymphocytes Relative: 23.1 % (ref 12.0–46.0)
Lymphs Abs: 0.9 10*3/uL (ref 0.7–4.0)
MCHC: 35.8 g/dL (ref 30.0–36.0)
MCV: 89.7 fl (ref 78.0–100.0)
Monocytes Absolute: 0.3 10*3/uL (ref 0.1–1.0)
Monocytes Relative: 7.5 % (ref 3.0–12.0)
Neutro Abs: 2.5 10*3/uL (ref 1.4–7.7)
Neutrophils Relative %: 65.8 % (ref 43.0–77.0)
Platelets: 133 10*3/uL — ABNORMAL LOW (ref 150.0–400.0)
RBC: 4.81 Mil/uL (ref 4.22–5.81)
RDW: 13.2 % (ref 11.5–15.5)
WBC: 3.9 10*3/uL — ABNORMAL LOW (ref 4.0–10.5)

## 2020-04-27 LAB — LIPID PANEL
Cholesterol: 124 mg/dL (ref 0–200)
HDL: 33.3 mg/dL — ABNORMAL LOW (ref >39.00)
LDL Cholesterol: 79 mg/dL (ref 0–99)
NonHDL: 90.78
Total CHOL/HDL Ratio: 4
Triglycerides: 58 mg/dL (ref 0.0–149.0)
VLDL: 11.6 mg/dL (ref 0.0–40.0)

## 2020-04-27 LAB — HEPATIC FUNCTION PANEL
ALT: 17 U/L (ref 0–53)
AST: 18 U/L (ref 0–37)
Albumin: 4.5 g/dL (ref 3.5–5.2)
Alkaline Phosphatase: 108 U/L (ref 39–117)
Bilirubin, Direct: 0.2 mg/dL (ref 0.0–0.3)
Total Bilirubin: 1.1 mg/dL (ref 0.2–1.2)
Total Protein: 6.7 g/dL (ref 6.0–8.3)

## 2020-04-27 LAB — TSH: TSH: 3.79 u[IU]/mL (ref 0.35–4.50)

## 2020-04-27 LAB — BASIC METABOLIC PANEL
BUN: 15 mg/dL (ref 6–23)
CO2: 27 mEq/L (ref 19–32)
Calcium: 8.9 mg/dL (ref 8.4–10.5)
Chloride: 105 mEq/L (ref 96–112)
Creatinine, Ser: 1.01 mg/dL (ref 0.40–1.50)
GFR: 80.38 mL/min (ref >60.00)
Glucose, Bld: 80 mg/dL (ref 70–99)
Potassium: 3.9 mEq/L (ref 3.5–5.1)
Sodium: 138 mEq/L (ref 135–145)

## 2020-04-27 NOTE — Progress Notes (Signed)
Subjective:    Patient ID: Larry Brooks, male    DOB: Nov 03, 1976, 44 y.o.   MRN: [id number]  HPI  Patient is a 44 yr old male who presents today for annual physical.   Immunizations: completed pfizer series, Tdap 2020 Diet: reports that he eats a light lunch, reports that he was 190 lbs Exercise: walking more (4-5 times a week 25-30 minutes), plays golf Vision: up to date Dental: up to date Wt Readings from Last 3 Encounters:  04/27/20 182 lb (82.6 kg)  04/25/19 179 lb 9.6 oz (81.5 kg)  02/26/18 180 lb (81.6 kg)       Review of Systems  Constitutional: Negative for unexpected weight change.  HENT: Negative for hearing loss and rhinorrhea.   Eyes: Negative for visual disturbance.  Respiratory: Negative for cough and shortness of breath.   Cardiovascular: Negative for chest pain.  Gastrointestinal: Negative for blood in stool, constipation and diarrhea.  Genitourinary: Negative for difficulty urinating, dysuria, frequency and hematuria.  Musculoskeletal: Negative for arthralgias and myalgias.  Skin: Negative for rash.  Neurological: Positive for tremors (occasional/intermittent- worse with stress- ). Negative for headaches.  Hematological: Negative for adenopathy.  Psychiatric/Behavioral:       Denies current depression Anxiety- stable       Past Medical History:  Diagnosis Date  . Childhood asthma   . Generalized anxiety disorder   . Leukopenia   . Thrombocytopenia ([place])      Social History   Socioeconomic History  . Marital status: Married    Spouse name: Not on file  . Number of children: Not on file  . Years of education: Not on file  . Highest education level: Not on file  Occupational History  . Not on file  Tobacco Use  . Smoking status: Never Smoker  . Smokeless tobacco: Never Used  . Tobacco comment: never used tobacco  Substance and Sexual Activity  . Alcohol use: Yes    Comment: very rare-social  . Drug use: No  . Sexual activity: Not on  file  Other Topics Concern  . 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 Difficulty of Paying Living Expenses:   Food Insecurity:   . Worried About Charity fundraiser in the Last Year:   . Arboriculturist in the Last Year:   Transportation Needs:   . Film/video editor (Medical):   Marland Kitchen Lack of Transportation (Non-Medical):   Physical Activity:   . Days of Exercise per Week:   . Minutes of Exercise per Session:   Stress:   . Feeling of Stress :   Social Connections:   . Frequency of Communication with Friends and Family:   . Frequency of Social Gatherings with Friends and Family:   . Attends Religious Services:   . Active Member of Clubs or Organizations:   . Attends Archivist Meetings:   Marland Kitchen Marital Status:   Intimate Partner Violence:   . Fear of Current or Ex-Partner:   . Emotionally Abused:   Marland Kitchen Physically Abused:   . Sexually Abused:     Past Surgical History:  Procedure Laterality Date  . TONSILLECTOMY    . WISDOM TOOTH EXTRACTION      Family History  Problem Relation Age of Onset  . Coronary artery disease Father        status post 4 stents   . Cancer Father        prostate  . Hyperlipidemia Father   . Diabetes type II Unknown        grandfather  . Hyperlipidemia Unknown        grandfather  . Heart attack Paternal Grandfather   . Hyperlipidemia Paternal Grandfather   . Colon cancer Neg Hx     No Known Allergies  Current Outpatient Medications on File Prior to Visit  Medication Sig Dispense Refill  . cetirizine (ZYRTEC) 10 MG tablet Take 10 mg by mouth daily as needed for allergies.     . Probiotic Product (PROBIOTIC ADVANCED PO) Take 3 capsules by mouth daily.     No current facility-administered medications on file prior to visit.      BP 109/72 (BP Location: Right Arm, Patient Position: Sitting, Cuff Size: Small)   Pulse 74   Temp 97.8 F (36.6 C) (Temporal)   Resp 16   Ht 5' 11"  (1.803 m)   Wt 182 lb (82.6 kg)   SpO2 100%   BMI 25.38 kg/m    Objective:   Physical Exam  Physical Exam  Constitutional: He is oriented to person, place, and time. He appears well-developed and well-nourished. No distress.  HENT:  Head: Normocephalic and atraumatic.  Right Ear: Tympanic membrane and ear canal normal.  Left Ear: Tympanic membrane and ear canal normal.  Mouth/Throat: Not examined- pt wearing mask Eyes: Pupils are equal, round, and reactive to light. No scleral icterus.  Neck: Normal range of motion. No thyromegaly present.  Cardiovascular: Normal rate and regular rhythm.   No murmur heard. Pulmonary/Chest: Effort normal and breath sounds normal. No respiratory distress. He has no wheezes. He has no rales. He exhibits no tenderness.  Abdominal: Soft. Bowel sounds are normal. He exhibits no distension and no mass. There is no tenderness. There is no rebound and no guarding.  Musculoskeletal: He exhibits no edema.  Lymphadenopathy:    He has no cervical adenopathy.  Neurological: He is alert and oriented to person, place, and time. He has normal patellar reflexes. He exhibits normal muscle tone. Coordination normal.  Skin: Skin is warm and dry.  Psychiatric: He has a normal mood and affect. His behavior is normal. Judgment and thought content normal.           Assessment & Plan:   Preventative care- encouraged pt to continue work on healthy diet, exercise.  Immunizations reviewed and up to date. Obtain routine lab work.   This visit occurred during the SARS-CoV-2 public health emergency.  Safety protocols were in place, including screening questions prior to the visit, additional usage of staff PPE, and extensive cleaning of exam room while observing appropriate contact time as indicated for disinfecting  solutions.         Assessment & Plan:

## 2020-04-27 NOTE — Patient Instructions (Signed)
Please complete lab work prior to leaving.  Preventive Care 44-44 Years Old, Male Preventive care refers to lifestyle choices and visits with your health care provider that can promote health and wellness. This includes:  A yearly physical exam. This is also called an annual well check.  Regular dental and eye exams.  Immunizations.  Screening for certain conditions.  Healthy lifestyle choices, such as eating a healthy diet, getting regular exercise, not using drugs or products that contain nicotine and tobacco, and limiting alcohol use. What can I expect for my preventive care visit? Physical exam Your health care provider will check:  Height and weight. These may be used to calculate body mass index (BMI), which is a measurement that tells if you are at a healthy weight.  Heart rate and blood pressure.  Your skin for abnormal spots. Counseling Your health care provider may ask you questions about:  Alcohol, tobacco, and drug use.  Emotional well-being.  Home and relationship well-being.  Sexual activity.  Eating habits.  Work and work environment. What immunizations do I need?  Influenza (flu) vaccine  This is recommended every year. Tetanus, diphtheria, and pertussis (Tdap) vaccine  You may need a Td booster every 10 years. Varicella (chickenpox) vaccine  You may need this vaccine if you have not already been vaccinated. Zoster (shingles) vaccine  You may need this after age 60. Measles, mumps, and rubella (MMR) vaccine  You may need at least one dose of MMR if you were born in 1957 or later. You may also need a second dose. Pneumococcal conjugate (PCV13) vaccine  You may need this if you have certain conditions and were not previously vaccinated. Pneumococcal polysaccharide (PPSV23) vaccine  You may need one or two doses if you smoke cigarettes or if you have certain conditions. Meningococcal conjugate (MenACWY) vaccine  You may need this if you have  certain conditions. Hepatitis A vaccine  You may need this if you have certain conditions or if you travel or work in places where you may be exposed to hepatitis A. Hepatitis B vaccine  You may need this if you have certain conditions or if you travel or work in places where you may be exposed to hepatitis B. Haemophilus influenzae type b (Hib) vaccine  You may need this if you have certain risk factors. Human papillomavirus (HPV) vaccine  If recommended by your health care provider, you may need three doses over 6 months. You may receive vaccines as individual doses or as more than one vaccine together in one shot (combination vaccines). Talk with your health care provider about the risks and benefits of combination vaccines. What tests do I need? Blood tests  Lipid and cholesterol levels. These may be checked every 5 years, or more frequently if you are over 44 years old.  Hepatitis C test.  Hepatitis B test. Screening  Lung cancer screening. You may have this screening every year starting at age 55 if you have a 30-pack-year history of smoking and currently smoke or have quit within the past 15 years.  Prostate cancer screening. Recommendations will vary depending on your family history and other risks.  Colorectal cancer screening. All adults should have this screening starting at age 50 and continuing until age 75. Your health care provider may recommend screening at age 45 if you are at increased risk. You will have tests every 1-10 years, depending on your results and the type of screening test.  Diabetes screening. This is done by checking your   your blood sugar (glucose) after you have not eaten for a while (fasting). You may have this done every 1-3 years.  Sexually transmitted disease (STD) testing. Follow these instructions at home: Eating and drinking  Eat a diet that includes fresh fruits and vegetables, whole grains, lean protein, and low-fat dairy products.  Take  vitamin and mineral supplements as recommended by your health care provider.  Do not drink alcohol if your health care provider tells you not to drink.  If you drink alcohol: ? Limit how much you have to 0-2 drinks a day. ? Be aware of how much alcohol is in your drink. In the [place] one drink equals one 12 oz bottle of beer (355 mL), one 5 oz glass of wine (148 mL), or one 1 oz glass of hard liquor (44 mL). Lifestyle  Take daily care of your teeth and gums.  Stay active. Exercise for at least 30 minutes on 5 or more days each week.  Do not use any products that contain nicotine or tobacco, such as cigarettes, e-cigarettes, and chewing tobacco. If you need help quitting, ask your health care provider.  If you are sexually active, practice safe sex. Use a condom or other form of protection to prevent STIs (sexually transmitted infections).  Talk with your health care provider about taking a low-dose aspirin every day starting at age 62. What's next?  Go to your health care provider once a year for a well check visit.  Ask your health care provider how often you should have your eyes and teeth checked.  Stay up to date on all vaccines. This information is not intended to replace advice given to you by your health care provider. Make sure you discuss any questions you have with your health care provider. Document Revised: 11/07/2018 Document Reviewed: 11/07/2018 Elsevier Patient Education  2020 [employer].

## 2020-07-04 ENCOUNTER — Encounter: Payer: Self-pay | Admitting: Family

## 2020-07-07 ENCOUNTER — Encounter: Payer: Self-pay | Admitting: Family

## 2020-07-15 ENCOUNTER — Ambulatory Visit (INDEPENDENT_AMBULATORY_CARE_PROVIDER_SITE_OTHER): Payer: BC Managed Care – PPO

## 2020-07-15 ENCOUNTER — Other Ambulatory Visit: Payer: Self-pay

## 2020-07-15 DIAGNOSIS — Z23 Encounter for immunization: Secondary | ICD-10-CM

## 2020-07-15 NOTE — Progress Notes (Signed)
Larry Brooks is a 44 y.o. male presents to the office today for pneumovax-23 injections, per physician's orders. Original order: pm patient message note from 07-04-2020. Pneumovax-23 was administered IM today on right deltoid. Patient tolerated injection.   [name]

## 2020-12-09 ENCOUNTER — Encounter: Payer: Self-pay | Admitting: Medical

## 2020-12-09 ENCOUNTER — Telehealth (INDEPENDENT_AMBULATORY_CARE_PROVIDER_SITE_OTHER): Payer: BC Managed Care – PPO | Admitting: Medical

## 2020-12-09 ENCOUNTER — Telehealth: Payer: Self-pay

## 2020-12-09 ENCOUNTER — Other Ambulatory Visit: Payer: Self-pay

## 2020-12-09 VITALS — BP 109/83 | HR 94

## 2020-12-09 DIAGNOSIS — R059 Cough, unspecified: Secondary | ICD-10-CM

## 2020-12-09 DIAGNOSIS — R0981 Nasal congestion: Secondary | ICD-10-CM | POA: Diagnosis not present

## 2020-12-09 DIAGNOSIS — R52 Pain, unspecified: Secondary | ICD-10-CM | POA: Diagnosis not present

## 2020-12-09 MED ORDER — FLUTICASONE PROPIONATE 50 MCG/ACT NA SUSP: 2.0000 | Freq: Every day | NASAL | 1 refills | Status: DC

## 2020-12-09 MED ORDER — GUAIFENESIN-CODEINE 100-10 MG/5ML PO SOLN: 5.0000 mL | Freq: Three times a day (TID) | ORAL | 0 refills | Status: DC | PRN

## 2020-12-09 MED ORDER — HYDROCODONE-HOMATROPINE 5-1.5 MG/5ML PO SYRP: 5.0000 mL | ORAL_SOLUTION | Freq: Four times a day (QID) | ORAL | 0 refills | Status: DC | PRN

## 2020-12-09 MED ORDER — AZITHROMYCIN 250 MG PO TABS: ORAL_TABLET | ORAL | 0 refills | Status: DC

## 2020-12-09 NOTE — Telephone Encounter (Signed)
Nurse Assessment Nurse: Micki Riley, RN, Domenick Gong Date/Time (Eastern Time): 12/09/2020 7:53:00 AM Confirm and document reason for call. If symptomatic, describe symptoms. ---Caller states he has been having a cough, congestion, & sore throat; first noted Monday night. Denies any other symptoms. States is COVID vaccinated, last dose May or June 2021. Does the patient have any new or worsening symptoms? ---Yes Will a triage be completed? ---Yes Related visit to physician within the last 2 weeks? ---N/A Does the PT have any chronic conditions? (i.e. diabetes, asthma, this includes High risk factors for pregnancy, etc.) ---Unknown Is this a behavioral health or substance abuse call? ---No Guidelines Guideline Title Affirmed Question Affirmed Notes Nurse Date/Time ([place] Time) COVID-19 - Diagnosed or Suspected [1] Continuous (nonstop) coughing interferes with work or school AND [2] no improvement using cough treatment per Care Advice [place], RN, Domenick Gong 12/09/2020 7:55:51 AM Disp. Time [place] Ghazi Time) Disposition Final User 12/09/2020 8:00:16 AM Call PCP within 24 Hours Yes Cazares, RN, Domenick Gong Caller Disagree/Comply Comply Caller Understands Yes PLEASE NOTE: All timestamps contained within this report are represented as [place] Standard Time. CONFIDENTIALTY NOTICE: This fax transmission is intended only for the addressee. It contains information that is legally privileged, confidential or otherwise protected from use or disclosure. If you are not the intended recipient, you are strictly prohibited from reviewing, disclosing, copying using or disseminating any of this information or taking any action in reliance on or regarding this information. If you have received this fax in error, please notify us immediately by telephone so that we can arrange for its return to [place]. Phone: [phone], Toll-Free: [phone], Fax: [phone] Page: 2 of 2 Call Id:  [id number] PreDisposition InappropriateToAsk Care Advice Given Per Guideline CALL PCP WITHIN 24 HOURS: HOW TO PROTECT OTHERS - WHEN YOU ARE SICK WITH COVID-19: CALL BACK IF: * Fever over 103 F (39.4 C) * Chest pain or difficulty breathing occurs * You become worse CARE ADVICE given per COVID-19 - DIAGNOSED OR SUSPECTED (Adult) guideline. Comments User: Magnus Sinning, RN Date/Time [place] Ghazi Time): 12/09/2020 8:02:24 AM Transferred patient to office mainline [phone] for caller to make appt. Referrals REFERRED TO PCP OFFICE   Appt w/ Percell Miller today.

## 2020-12-09 NOTE — Patient Instructions (Signed)
History of recent mild to moderate viral syndrome symptoms.  Symptoms include nasal congestion, intermittent cough ear pressure and mild intermittent body aches.  Has been vaccinated against COVID.  Counseled trying to get tested for COVID.  If possible run rapid test if you can find those at the pharmacy.  If not then try to get scheduled for a testing to test center.  Test center number given to call and get scheduled.  Rest and hydrate.  Can take Tylenol or ibuprofen for body aches.  Prescription of Flonase nasal spray sent for nasal congestion and Hycodan cough syrup.  Making azithromycin antibiotic available for potential sinus infection type symptoms or bronchitis type symptoms.  Follow-up in 7 to 10 days or as needed.  Please give [place] an update on whether he test negative or positive.  In the event of positive discussed benefit of O2 sat monitor checking as well as over-the-counter vitamin D and zinc use.

## 2020-12-09 NOTE — Progress Notes (Signed)
Subjective:    Patient ID: Larry Brooks, male    DOB: 26-May-1976, 45 y.o.   MRN: [id number]  HPI  Virtual Visit via Video Note  I connected with Domingo Sep on 12/09/20 at  8:20 AM EST by a video enabled telemedicine application and verified that I am speaking with the correct person using two identifiers.  Location: Patient: home Provider: office  Participants- pt and myself.   I discussed the limitations of evaluation and management by telemedicine and the availability of in person appointments. The patient expressed understanding and agreed to proceed.    History of Present Illness:  Monday got nasal congested. Pt wife has been sick recently but she has improved. Pt did not sleep well Monday night. He states intermittent cough. Very hoarse voice. Has ear pressure. Mild body aches intermittent. No fever. Pt take ibuprofen and feels better. Pt states taking cough med that helps him sleep.  Pt has been quarantined. Since his wife exposed to covid.   Pt works from home.  Pt trying to stay hydrated. He is on zyrtec.      Observations/Objective:  General-no acute distress, pleasant, oriented. Lungs- on inspection lungs appear unlabored. Neck- no tracheal deviation or jvd on inspection. Neuro- gross motor function appears intact.   Assessment and Plan: History of recent mild to moderate viral syndrome symptoms.  Symptoms include nasal congestion, intermittent cough ear pressure and mild intermittent body aches.  Has been vaccinated against COVID.  Counseled trying to get tested for COVID.  If possible run rapid test if you can find those at the pharmacy.  If not then try to get scheduled for a testing to test center.  Test center number given to call and get scheduled.  Rest and hydrate.  Can take Tylenol or ibuprofen for body aches.  Prescription of Flonase nasal spray sent for nasal congestion and Hycodan cough syrup.  Making azithromycin antibiotic available  for potential sinus infection type symptoms or bronchitis type symptoms.  Follow-up in 7 to 10 days or as needed.  Please give [place] an update on whether he test negative or positive.  In the event of positive discussed benefit of O2 sat monitor checking as well as over-the-counter vitamin D and zinc use.  Time spent with patient today was 25  minutes which consisted of chart revdiew, discussing diagnosis, work up treatment and documentation.  Follow Up Instructions:    I discussed the assessment and treatment plan with the patient. The patient was provided an opportunity to ask questions and all were answered. The patient agreed with the plan and demonstrated an understanding of the instructions.   The patient was advised to call back or seek an in-person evaluation if the symptoms worsen or if the condition fails to improve as anticipated.     Mackie Pai, PA-C   Review of Systems  Constitutional: Positive for fatigue.  HENT: Positive for congestion and postnasal drip. Negative for ear pain, sinus pressure and sinus pain.   Respiratory: Positive for cough. Negative for choking, shortness of breath and wheezing.   Cardiovascular: Negative for chest pain and palpitations.  Gastrointestinal: Negative for abdominal pain.  Genitourinary: Negative for dysuria.  Musculoskeletal: Positive for myalgias. Negative for back pain, neck pain and neck stiffness.  Skin: Negative for rash.  Neurological: Negative for dizziness, speech difficulty, weakness, numbness and headaches.  Hematological: Negative for adenopathy. Does not bruise/bleed easily.  Psychiatric/Behavioral: Negative for behavioral problems and confusion.    Past Medical History:  Diagnosis Date  . Childhood asthma   . Generalized anxiety disorder   . Leukopenia   . Thrombocytopenia ([place])      Social History   Socioeconomic History  . Marital status: Married    Spouse name: Not on file  . Number of children: Not on  file  . Years of education: Not on file  . Highest education level: Not on file  Occupational History  . Not on file  Tobacco Use  . Smoking status: Never Smoker  . Smokeless tobacco: Never Used  . Tobacco comment: never used tobacco  Vaping Use  . Vaping Use: Never used  Substance and Sexual Activity  . Alcohol use: Yes    Comment: very rare-social  . Drug use: No  . Sexual activity: Not on file  Other Topics Concern  . 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 TONSILLECTOMY    . WISDOM TOOTH EXTRACTION      Family History  Problem Relation Age of Onset  . Coronary artery disease Father        status post 4 stents   . Cancer Father        prostate  . Hyperlipidemia Father   . Diabetes type II Other        grandfather  . Hyperlipidemia Other        grandfather  . Heart attack Paternal Grandfather   . Hyperlipidemia Paternal Grandfather   . Colon cancer Neg Hx     No Known Allergies  Current Outpatient Medications on File Prior to Visit  Medication Sig Dispense Refill  . cetirizine (ZYRTEC) 10 MG tablet Take 10 mg by mouth daily as needed for allergies.  (Patient not taking: Reported on 12/09/2020)    . Probiotic Product (PROBIOTIC ADVANCED PO) Take 3 capsules by mouth daily. (Patient not taking: Reported on 12/09/2020)     No current facility-administered medications on file prior to visit.    There were no vitals taken for this visit.      Objective:   Physical Exam        Assessment &  Plan:

## 2020-12-09 NOTE — Addendum Note (Signed)
Addended by: Anabel Halon on: 12/09/2020 01:48 PM   Modules accepted: Orders

## 2020-12-10 ENCOUNTER — Other Ambulatory Visit: Payer: BC Managed Care – PPO

## 2020-12-10 DIAGNOSIS — Z20822 Contact with and (suspected) exposure to covid-19: Secondary | ICD-10-CM

## 2020-12-13 ENCOUNTER — Telehealth: Payer: Self-pay | Admitting: Medical

## 2020-12-13 DIAGNOSIS — R062 Wheezing: Secondary | ICD-10-CM

## 2020-12-13 DIAGNOSIS — R059 Cough, unspecified: Secondary | ICD-10-CM

## 2020-12-13 LAB — NOVEL CORONAVIRUS, NAA: SARS-CoV-2, NAA: DETECTED — AB

## 2020-12-13 MED ORDER — ALBUTEROL SULFATE HFA 108 (90 BASE) MCG/ACT IN AERS: 2.0000 | INHALATION_SPRAY | Freq: Four times a day (QID) | RESPIRATORY_TRACT | 0 refills | Status: DC | PRN

## 2020-12-13 NOTE — Telephone Encounter (Signed)
Chest xray placed. Albuterol inhaler sent to pharmacy.

## 2020-12-14 ENCOUNTER — Ambulatory Visit (HOSPITAL_BASED_OUTPATIENT_CLINIC_OR_DEPARTMENT_OTHER)
Admission: RE | Admit: 2020-12-14 | Discharge: 2020-12-14 | Disposition: A | Discharge status: Home / Self Care | Payer: BC Managed Care – PPO | Source: Ambulatory Visit | Attending: Medical | Admitting: Medical

## 2020-12-14 ENCOUNTER — Other Ambulatory Visit: Payer: Self-pay

## 2020-12-14 ENCOUNTER — Other Ambulatory Visit: Payer: Self-pay | Admitting: Medical

## 2020-12-14 ENCOUNTER — Encounter (HOSPITAL_BASED_OUTPATIENT_CLINIC_OR_DEPARTMENT_OTHER): Payer: Self-pay

## 2020-12-14 ENCOUNTER — Ambulatory Visit: Payer: BC Managed Care – PPO

## 2020-12-14 ENCOUNTER — Encounter: Payer: Self-pay | Admitting: Medical

## 2020-12-14 ENCOUNTER — Ambulatory Visit (HOSPITAL_BASED_OUTPATIENT_CLINIC_OR_DEPARTMENT_OTHER): Admission: RE | Admit: 2020-12-14 | Payer: BC Managed Care – PPO | Source: Ambulatory Visit

## 2020-12-14 DIAGNOSIS — R062 Wheezing: Secondary | ICD-10-CM

## 2020-12-14 DIAGNOSIS — R059 Cough, unspecified: Secondary | ICD-10-CM

## 2020-12-14 DIAGNOSIS — U071 COVID-19: Secondary | ICD-10-CM | POA: Diagnosis not present

## 2020-12-14 NOTE — Telephone Encounter (Signed)
Patient arrived at [facility] for xray. Said he was sent here by his PCP but unsure why as it was not convenient for him. Patient confirms he is COVID positive. Advised him we cannot perform the xray on a COVID + patient and to contact his PCP for instruction.

## 2020-12-24 ENCOUNTER — Other Ambulatory Visit: Payer: Self-pay | Admitting: Medical

## 2020-12-24 ENCOUNTER — Other Ambulatory Visit: Payer: Self-pay

## 2020-12-24 ENCOUNTER — Ambulatory Visit: Payer: BC Managed Care – PPO | Admitting: Medical

## 2020-12-24 VITALS — BP 108/65 | HR 76 | Temp 98.5°F | Resp 20 | Ht 71.0 in | Wt 183.4 lb

## 2020-12-24 DIAGNOSIS — Z8616 Personal history of COVID-19: Secondary | ICD-10-CM

## 2020-12-24 DIAGNOSIS — R059 Cough, unspecified: Secondary | ICD-10-CM | POA: Diagnosis not present

## 2020-12-24 MED ORDER — HYDROCOD POLST-CPM POLST ER 10-8 MG/5ML PO SUER: 5.0000 mL | Freq: Two times a day (BID) | ORAL | 0 refills | Status: DC | PRN

## 2020-12-24 MED ORDER — FLUTICASONE PROPIONATE 50 MCG/ACT NA SUSP: 2.0000 | Freq: Every day | NASAL | 1 refills | Status: DC

## 2020-12-24 MED ORDER — METHYLPREDNISOLONE 4 MG PO TABS: ORAL_TABLET | ORAL | 0 refills | Status: DC

## 2020-12-24 MED ORDER — DOXYCYCLINE HYCLATE 100 MG PO TABS: 100.0000 mg | ORAL_TABLET | Freq: Two times a day (BID) | ORAL | 0 refills | Status: DC

## 2020-12-24 NOTE — Patient Instructions (Addendum)
Hx of covid with lingering signs and symptoms. Mostly cough and nasal congestion.  Hx of asthma and considering some reactive airway component.  Rx tussionex for persisting cough. Continue Flonase.  If cough persist despite the above and sinus pressure persists then start medrol taper and doxycycline. Print prescription given. [place] if needed as discussed.  Follow up 7-10 days if needed.

## 2020-12-24 NOTE — Progress Notes (Signed)
Subjective:    Patient ID: Larry Brooks, male    DOB: 09-23-1976, 45 y.o.   MRN: [id number]  HPI  Pt in for evaluation.   Pt has had covid recently. Cough is intermittent day and night worse in the evening.   Pt is still nasal congested.   Pt states robitussin ac seemed to make him cough worse.  Pt cxr on 12-14-20   IMPRESSION: Mild hyperinflation and right basilar atelectasis versus scarring.  No definite acute airspace process or pneumonia.  Pt tested positive about 2 weeks ago but he had symptoms for longer.   Pt took zpack, flonase and albuterol       Review of Systems  Constitutional: Negative for chills, fatigue and unexpected weight change.  HENT: Positive for congestion. Negative for hearing loss, nosebleeds, postnasal drip, sinus pressure, sinus pain and sneezing.        Mild sinus discomfort.  Respiratory: Negative for cough and wheezing.   Cardiovascular: Negative for chest pain and palpitations.  Gastrointestinal: Negative for abdominal pain.  Genitourinary: Negative for enuresis.  Musculoskeletal: Negative for back pain.  Neurological: Negative for dizziness, syncope, weakness, numbness and headaches.  Hematological: Negative for adenopathy. Does not bruise/bleed easily.  Psychiatric/Behavioral: Negative for behavioral problems and confusion.    Past Medical History:  Diagnosis Date  . Childhood asthma   . Generalized anxiety disorder   . Leukopenia   . Thrombocytopenia ([place])      Social History   Socioeconomic History  . Marital status: Married    Spouse name: Not on file  . Number of children: Not on file  . Years of education: Not on file  . Highest education level: Not on file  Occupational History  . Not on file  Tobacco Use  . Smoking status: Never Smoker  . Smokeless tobacco: Never Used  . Tobacco comment: never used tobacco  Vaping Use  . Vaping Use: Never used  Substance and Sexual Activity  . Alcohol use: Yes    Comment:  very rare-social  . Drug use: No  . Sexual activity: Not on file  Other Topics Concern  . 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 TONSILLECTOMY    . WISDOM TOOTH EXTRACTION      Family History  Problem Relation Age of Onset  . Coronary artery disease Father        status post 4 stents   . Cancer Father        prostate  . Hyperlipidemia Father   . Diabetes type II Other        grandfather  . Hyperlipidemia Other        grandfather  . Heart attack Paternal Grandfather   . Hyperlipidemia Paternal Grandfather   . Colon cancer Neg Hx     No Known Allergies  Current Outpatient Medications on File Prior to Visit  Medication Sig Dispense Refill  . albuterol (VENTOLIN HFA) 108 (90 Base) MCG/ACT inhaler Inhale  2 puffs into the lungs every 6 (six) hours as needed. 18 g 0  . fluticasone (FLONASE) 50 MCG/ACT nasal spray Place 2 sprays into both nostrils daily. 16 g 1  . azithromycin (ZITHROMAX) 250 MG tablet Take 2 tablets by mouth on day 1, followed by 1 tablet by mouth daily for 4 days. (Patient not taking: Reported on 12/24/2020) 6 tablet 0  . cetirizine (ZYRTEC) 10 MG tablet Take 10 mg by mouth daily as needed for allergies.  (Patient not taking: No sig reported)    . Probiotic Product (PROBIOTIC ADVANCED PO) Take 3 capsules by mouth daily. (Patient not taking: No sig reported)     No current facility-administered medications on file prior to visit.    BP 108/65   Pulse 76   Temp 98.5 F (36.9 C) (Oral)   Resp 20    Ht 5' 11"  (1.803 m)   Wt 183 lb 6.4 oz (83.2 kg)   SpO2 100%   BMI 25.58 kg/m        Objective:   Physical Exam   General Mental Status- Alert. General Appearance- Not in acute distress. Cough moderat and intermittent during exam. Skin General: Color- Normal Color. Moisture- Normal Moisture.  Neck Carotid Arteries- Normal color. Moisture- Normal Moisture. No carotid bruits. No JVD.  Chest and Lung Exam Auscultation: Breath Sounds:-Normal.  Cardiovascular Auscultation:Rythm- Regular. Murmurs & Other Heart Sounds:Auscultation of the heart reveals- No Murmurs.   Neurologic Cranial Nerve exam:- CN III-XII intact(No nystagmus), symmetric smile. Strength:- 5/5 equal and symmetric strength both upper and lower extremities.  Heent- pt states faint maxillary sinus discomfort but no pain.     Assessment & Plan:  Hx of covid with lingering signs and symptoms. Mostly cough and nasal congestion.  Hx of asthma and considering some reactive airway component.  Rx tussionex for persisting cough. Continue Flonase.  If cough persist despite the above and sinus pressure persists then start medrol taper and doxycycline. Print prescription given.  Follow up 7-10 days if needed.  Time spent with patient today was 31 minutes which consisted of chart review, discussing diagnosis, treatment, answering questions and documentation.

## 2021-01-04 ENCOUNTER — Other Ambulatory Visit: Payer: Self-pay | Admitting: Medical

## 2021-04-29 ENCOUNTER — Encounter: Payer: BC Managed Care – PPO | Admitting: Family

## 2021-05-11 ENCOUNTER — Encounter: Payer: BC Managed Care – PPO | Admitting: Family

## 2021-06-14 ENCOUNTER — Telehealth (INDEPENDENT_AMBULATORY_CARE_PROVIDER_SITE_OTHER): Payer: BC Managed Care – PPO | Admitting: Family

## 2021-06-14 ENCOUNTER — Encounter: Payer: Self-pay | Admitting: Family

## 2021-06-14 DIAGNOSIS — J40 Bronchitis, not specified as acute or chronic: Secondary | ICD-10-CM | POA: Diagnosis not present

## 2021-06-14 DIAGNOSIS — J9801 Acute bronchospasm: Secondary | ICD-10-CM | POA: Insufficient documentation

## 2021-06-14 MED ORDER — PREDNISONE 10 MG PO TABS
ORAL_TABLET | ORAL | 0 refills | Status: DC
Start: 2021-06-14 — End: 2021-07-15

## 2021-06-14 MED ORDER — AZITHROMYCIN 250 MG PO TABS: ORAL_TABLET | ORAL | 0 refills | Status: AC

## 2021-06-14 MED ORDER — ALBUTEROL SULFATE HFA 108 (90 BASE) MCG/ACT IN AERS
2.0000 | INHALATION_SPRAY | Freq: Four times a day (QID) | RESPIRATORY_TRACT | 5 refills | Status: DC | PRN
Start: 2021-06-14 — End: 2022-03-27

## 2021-06-14 NOTE — Progress Notes (Signed)
Virtual telephone visit    Virtual Visit via Telephone Note   This visit type was conducted due to national recommendations for restrictions regarding the COVID-19 Pandemic (e.g. social distancing) in an effort to limit this patient's exposure and mitigate transmission in our community. Due to his co-morbid illnesses, this patient is at least at moderate risk for complications without adequate follow up. This format is felt to be most appropriate for this patient at this time. The patient did not have access to video technology or had technical difficulties with video requiring transitioning to audio format only (telephone). Physical exam was limited to content and character of the telephone converstion. CMA was able to get the patient set up on a telephone visit.   Patient location: Home. Patient and provider in visit Provider location: Office  I discussed the limitations of evaluation and management by telemedicine and the availability of in person appointments. The patient expressed understanding and agreed to proceed.   Visit Date: 06/14/2021  Today's healthcare provider: Nance Pear, NP     Subjective:    Patient ID: Larry Brooks, male    DOB: 11-17-1976, 45 y.o.   MRN: [id number]  Chief Complaint  Patient presents with   Cough    4 weeks, no fever,post nasal drip, congestion,    HPI  Pt reports that he developed nasal congestion/post-nasal drip 4 weeks ago. Had associated cough- off/on. Continued zyrtec.  Used flonase.  Symptoms continued. He recently changed jobs.  Did a virtual visit at work. Was told viral bronchitis. He was rx'd benzonatate. (This dried him out and made him feel worse).  He had some doxycycline on hand from a previous illness and completed a 7 day coarse.  He travelled to [place] last week for work.  He returned and continued to cough. Cough is minimally productive. Denies significant wheezing.  He did a home test for covid 2-3 weeks ago. He  had an old albuterol inhaler at home which he has used prn and this has helped his symptoms.  Past Medical History:  Diagnosis Date   Childhood asthma    Generalized anxiety disorder    Leukopenia    Thrombocytopenia (HCC)     Past Surgical History:  Procedure Laterality Date   TONSILLECTOMY     WISDOM TOOTH EXTRACTION      Family History  Problem Relation Age of Onset   Coronary artery disease Father        status post 4 stents    Cancer Father        prostate   Hyperlipidemia Father    Diabetes type II Other        grandfather   Hyperlipidemia Other        grandfather   Heart attack Paternal Grandfather    Hyperlipidemia Paternal Grandfather    Colon cancer Neg Hx     Social History   Socioeconomic History   Marital status: Married    Spouse name: Not on file   Number of children: Not on file   Years of education: Not on file   Highest education level: Not on file  Occupational History   Not on file  Tobacco Use   Smoking status: Never   Smokeless tobacco: Never   Tobacco comments:    never used tobacco  Vaping Use   Vaping Use: Never used  Substance and Sexual Activity   Alcohol use: Yes    Comment: very rare-social   Drug use: No  Sexual activity: Not on file  Other Topics Concern   Not on file  Social History Narrative   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 [employer]   Married  2 sons, 2005 and 2012   Enjoys cooking, playing with his kids, reading, fishing   Social Determinants of Radio broadcast assistant Strain: Not on [employer] Insecurity: Not on file  Transportation Needs: Not on file  Physical Activity: Not on file  Stress: Not on file  Social Connections: Not on file  Intimate Partner Violence: Not on file    Outpatient Medications Prior to Visit  Medication Sig Dispense Refill   fluticasone (FLONASE) 50 MCG/ACT nasal spray PLACE 2 SPRAYS INTO BOTH NOSTRILS DAILY 16 g 1   Probiotic Product (PROBIOTIC ADVANCED PO) Take 3  capsules by mouth daily.     albuterol (VENTOLIN HFA) 108 (90 Base) MCG/ACT inhaler Inhale 2 puffs into the lungs every 6 (six) hours as needed for wheezing or shortness of breath. 18 g 5   azithromycin (ZITHROMAX) 250 MG tablet Take 2 tablets by mouth on day 1, followed by 1 tablet by mouth daily for 4 days. (Patient not taking: Reported on 12/24/2020) 6 tablet 0   cetirizine (ZYRTEC) 10 MG tablet Take 10 mg by mouth daily as needed for allergies.  (Patient not taking: No sig reported)     chlorpheniramine-HYDROcodone (TUSSIONEX) 10-8 MG/5ML SUER TAKE 5MLS BY MOUTH EVERY 12 HOURS AS NEEDED FOR COUGH 140 mL 0   doxycycline (VIBRA-TABS) 100 MG tablet Take 1 tablet (100 mg total) by mouth 2 (two) times daily. 14 tablet 0   fluticasone (FLONASE) 50 MCG/ACT nasal spray Place 2 sprays into both nostrils daily. 16 g 1   methylPREDNISolone (MEDROL) 4 MG tablet Standard taper over 6 days 21 tablet 0   No facility-administered medications prior to visit.    No Known Allergies  ROS See HPI    Objective:    Physical Exam  There were no vitals taken for this visit. Wt Readings from Last 3 Encounters:  12/24/20 183 lb 6.4 oz (83.2 kg)  04/27/20 182 lb (82.6 kg)  04/25/19 179 lb 9.6 oz (81.5 kg)   Gen: Awake, alert, no acute distress Resp: Breathing sounds even and non-labored. Intermittent deep/coarse cough Psych: calm/pleasant demeanor Neuro: Alert and Oriented x 3,  speech is clear.       Assessment & Plan:   Problem List Items Addressed This Visit       Unprioritized   Bronchitis - Primary    New.  Will rx with prednisone taper as below and azithromycin as below. Refill provided for albuterol MDI prn. Pt is advised to call if symptoms worsen or if not improved in 3-4 days.         I have discontinued Larry Brooks's cetirizine, azithromycin, methylPREDNISolone, doxycycline, and chlorpheniramine-HYDROcodone. I am also having him start on azithromycin and predniSONE.  Additionally, I am having him maintain his Probiotic Product (PROBIOTIC ADVANCED PO), fluticasone, and albuterol.  Meds ordered this encounter  Medications   azithromycin (ZITHROMAX) 250 MG tablet    Sig: Take 2 tablets on day 1, then 1 tablet daily on days 2 through 5    Dispense:  6 tablet    Refill:  0    Order Specific Question:   Supervising Provider    Answer:   Penni Homans A [[id number]]   predniSONE (DELTASONE) 10 MG tablet    Sig: 4 tabs by mouth once  daily for 2 days, then 3 tabs daily x 2 days, then 2 tabs daily x 2 days, then 1 tab daily x 2 days    Dispense:  20 tablet    Refill:  0    Order Specific Question:   Supervising Provider    Answer:   Penni Homans A [[id number]]   albuterol (VENTOLIN HFA) 108 (90 Base) MCG/ACT inhaler    Sig: Inhale 2 puffs into the lungs every 6 (six) hours as needed for wheezing or shortness of breath.    Dispense:  6.7 g    Refill:  5    Order Specific Question:   Supervising Provider    Answer:   Penni Homans A [[id number]]     I discussed the assessment and treatment plan with the patient. The patient was provided an opportunity to ask questions and all were answered. The patient agreed with the plan and demonstrated an understanding of the instructions.   The patient was advised to call back or seek an in-person evaluation if the symptoms worsen or if the condition fails to improve as anticipated.  I provided 20 minutes of non-face-to-face time during this encounter.   Nance Pear, NP [employer] [facility] [phone] (phone) [phone] (fax)  [place]

## 2021-06-14 NOTE — Assessment & Plan Note (Signed)
New.  Will rx with prednisone taper as below and azithromycin as below. Refill provided for albuterol MDI prn. Pt is advised to call if symptoms worsen or if not improved in 3-4 days.

## 2021-07-15 ENCOUNTER — Encounter: Payer: Self-pay | Admitting: Family

## 2021-07-15 ENCOUNTER — Other Ambulatory Visit: Payer: Self-pay

## 2021-07-15 ENCOUNTER — Ambulatory Visit (INDEPENDENT_AMBULATORY_CARE_PROVIDER_SITE_OTHER): Payer: BC Managed Care – PPO | Admitting: Family

## 2021-07-15 DIAGNOSIS — Z0000 Encounter for general adult medical examination without abnormal findings: Secondary | ICD-10-CM | POA: Diagnosis not present

## 2021-07-15 NOTE — Progress Notes (Signed)
Subjective:   By signing my name below, I, Shehryar Baig, attest that this documentation has been prepared under the direction and in the presence of  Debbrah Alar NP, 07/15/2021   Patient ID: Larry Brooks, male    DOB: 02-24-1976, 45 y.o.   MRN: [id number]  Chief Complaint  Patient presents with   Annual Exam         HPI Patient is in today for a comprehensive physical exam.   Prostate- He reports his father had prostate cancer during his late 48's. He is interested in getting PSA screenings around age 36.  Blood pressure- His blood pressure is doing well during this visit.   BP Readings from Last 3 Encounters:  07/15/21 128/83  12/24/20 108/65  12/09/20 109/83   He denies having any unexpected weight change, ear pain, hearing loss and rhinorrhea, visual disturbance, cough, chest pain and leg swelling, constipation, nausea, vomiting, diarrhea and blood in stool, or dysuria and frequency, for myalgias and arthralgias, rash, headaches, adenopathy, depression or anxiety at this time.  He has no recent surgical procedures. He has no recent changes to his family medical history. He occasionally drinks alcohol. He does not use drugs. He only has one male partner. He does not use tobacco products. He does not use vape products.   Immunizations: He has 2 Covid-19 vaccines at this time and will wait for a new booster vaccine to release before releasing it. He is UTD on tetanus vaccines.  Diet: He is managing a healthy diet.  Exercise: He participates in exercise 3x weekly and walks for 20 min.  Colonoscopy- He is eligible for a routine colonoscopy next year and is willing to set up an upcomming appointment.  Dental: He is UTD on dental care.  Vision: He is UTD on vision care.  Past Medical History:  Diagnosis Date   Childhood asthma    Generalized anxiety disorder    Leukopenia    Thrombocytopenia (HCC)     Past Surgical History:  Procedure Laterality Date    TONSILLECTOMY     WISDOM TOOTH EXTRACTION      Family History  Problem Relation Age of Onset   Coronary artery disease Father        status post 4 stents    Cancer Father        prostate   Hyperlipidemia Father    Diabetes type II Other        grandfather   Hyperlipidemia Other        grandfather   Heart attack Paternal Grandfather    Hyperlipidemia Paternal Grandfather    Colon cancer Neg Hx     Social History   Socioeconomic History   Marital status: Married    Spouse name: Not on file   Number of children: Not on file   Years of education: Not on file   Highest education level: Not on file  Occupational History   Not on file  Tobacco Use   Smoking status: Never   Smokeless tobacco: Never   Tobacco comments:    never used tobacco  Vaping Use   Vaping Use: Never used  Substance and Sexual Activity   Alcohol use: Yes    Comment: very rare-social   Drug use: No   Sexual activity: Yes    Partners: Female  Other Topics Concern   Not on file  Social History Narrative   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 6.7 g 5   fluticasone (FLONASE) 50 MCG/ACT nasal spray PLACE 2 SPRAYS INTO BOTH NOSTRILS DAILY 16 g 1   Probiotic Product (PROBIOTIC ADVANCED PO) Take 3 capsules by mouth daily.     predniSONE (DELTASONE) 10 MG tablet 4 tabs by mouth once daily for 2 days, then 3 tabs daily x 2  days, then 2 tabs daily x 2 days, then 1 tab daily x 2 days 20 tablet 0   No facility-administered medications prior to visit.    No Known Allergies  Review of Systems  Constitutional:        (-)unexpected weight change (-)Adenopathy  HENT:  Negative for ear pain and hearing loss.        (-)rhinorrhea  Eyes:        (-)Visual disturbance  Respiratory:  Negative for cough.   Cardiovascular:  Negative for chest pain and leg swelling.  Gastrointestinal:  Negative for blood in stool, constipation, diarrhea, nausea and vomiting.  Genitourinary:  Negative for dysuria and frequency.  Musculoskeletal:  Negative for joint pain and myalgias.  Skin:  Negative for rash.  Neurological:  Negative for headaches.  Psychiatric/Behavioral:  Negative for depression. The patient is not nervous/anxious.       Objective:    Physical Exam Constitutional:      General: He is not in acute distress.    Appearance: Normal appearance. He is not ill-appearing.  HENT:     Head: Normocephalic and atraumatic.     Right Ear: Tympanic membrane, ear canal and external ear normal. There is impacted cerumen.     Left Ear: Tympanic membrane, ear canal and external ear normal. There is impacted cerumen.  Eyes:     Extraocular Movements: Extraocular movements intact.     Pupils: Pupils are equal, round, and reactive to light.     Comments: No nystagmus  Cardiovascular:     Rate and Rhythm: Normal rate and regular rhythm.     Heart sounds: Normal heart sounds. No murmur heard.   No gallop.  Pulmonary:     Effort: Pulmonary effort is normal. No respiratory distress.     Breath sounds: Normal breath sounds. No wheezing or rales.  Abdominal:     General: Bowel sounds are normal. There is no distension.     Palpations: Abdomen is soft.     Tenderness: There is no abdominal tenderness. There is no guarding.  Musculoskeletal:     Comments: 5/5 strength in both upper and lower extremities  Lymphadenopathy:      Cervical: No cervical adenopathy.  Skin:    General: Skin is warm and dry.  Neurological:     Mental Status: He is alert and oriented to person, place, and time.     Deep Tendon Reflexes:     Reflex Scores:      Patellar reflexes are 3+ on the right side and 3+ on the  left side. Psychiatric:        Behavior: Behavior normal.    BP 128/83 (BP Location: Right Arm, Patient Position: Sitting, Cuff Size: Small)   Pulse 76   Temp 98.3 F (36.8 C) (Oral)   Resp 16   Ht 5' 11"  (1.803 m)   Wt 182 lb (82.6 kg)   SpO2 100%   BMI 25.38 kg/m  Wt Readings from Last 3 Encounters:  07/15/21 182 lb (82.6 kg)  12/24/20 183 lb 6.4 oz (83.2 kg)  04/27/20 182 lb (82.6 kg)    Diabetic Foot Exam - Simple   No data filed    Lab Results  Component Value Date   WBC 3.9 (L) 04/27/2020   HGB 15.4 04/27/2020   HCT 43.1 04/27/2020   PLT 133.0 (L) 04/27/2020   GLUCOSE 80 04/27/2020   CHOL 124 04/27/2020   TRIG 58.0 04/27/2020   HDL 33.30 (L) 04/27/2020   LDLCALC 79 04/27/2020   ALT 17 04/27/2020   AST 18 04/27/2020   NA 138 04/27/2020   K 3.9 04/27/2020   CL 105 04/27/2020   CREATININE 1.01 04/27/2020   BUN 15 04/27/2020   CO2 27 04/27/2020   TSH 3.79 04/27/2020    Lab Results  Component Value Date   TSH 3.79 04/27/2020   Lab Results  Component Value Date   WBC 3.9 (L) 04/27/2020   HGB 15.4 04/27/2020   HCT 43.1 04/27/2020   MCV 89.7 04/27/2020   PLT 133.0 (L) 04/27/2020   Lab Results  Component Value Date   NA 138 04/27/2020   K 3.9 04/27/2020   CO2 27 04/27/2020   GLUCOSE 80 04/27/2020   BUN 15 04/27/2020   CREATININE 1.01 04/27/2020   BILITOT 1.1 04/27/2020   ALKPHOS 108 04/27/2020   AST 18 04/27/2020   ALT 17 04/27/2020   PROT 6.7 04/27/2020   ALBUMIN 4.5 04/27/2020   CALCIUM 8.9 04/27/2020   GFR 80.38 04/27/2020   Lab Results  Component Value Date   CHOL 124 04/27/2020   Lab Results  Component Value Date   HDL 33.30 (L) 04/27/2020   Lab Results   Component Value Date   LDLCALC 79 04/27/2020   Lab Results  Component Value Date   TRIG 58.0 04/27/2020   Lab Results  Component Value Date   CHOLHDL 4 04/27/2020   No results found for: HGBA1C     Assessment & Plan:   Problem List Items Addressed This Visit       Unprioritized   Preventative health care    Recommended that he get a covid booster this fall when the new formulation becomes available.  Encouraged pt to increase exercise from 3 days/week to 5 days/week. Recommended flu shot this fall.He will need a colonoscopy after his birthday which we discussed. Labs were normal last year.  Will defer lab work this year and plan to do next year.  We discussed starting PSA's in his late 45's.         No orders of the defined types were placed in this encounter.   I,  Debbrah Alar NP, personally preformed the services described in this documentation.  All medical record entries made by the scribe were at my direction and in my presence.  I have reviewed the chart and discharge instructions (if applicable) and agree that the record reflects my personal performance and is accurate and complete. 07/15/2021   I,Shehryar Baig,acting as a scribe for Nance Pear, NP.,have documented all  relevant documentation on the behalf of Nance Pear, NP,as directed by  Nance Pear, NP while in the presence of Nance Pear, NP.   Nance Pear, NP

## 2021-07-15 NOTE — Assessment & Plan Note (Addendum)
Recommended that he get a covid booster this fall when the new formulation becomes available.  Encouraged pt to increase exercise from 3 days/week to 5 days/week. Recommended flu shot this fall.He will need a colonoscopy after his birthday which we discussed. Labs were normal last year.  Will defer lab work this year and plan to do next year.  We discussed starting PSA's in his late 36's.

## 2021-09-05 ENCOUNTER — Telehealth: Payer: Self-pay | Admitting: Family

## 2021-09-05 DIAGNOSIS — Z1211 Encounter for screening for malignant neoplasm of colon: Secondary | ICD-10-CM

## 2021-09-05 NOTE — Telephone Encounter (Signed)
See mychart.  

## 2021-09-13 NOTE — Addendum Note (Signed)
Addended by: Debbrah Alar on: 09/13/2021 03:51 PM   Modules accepted: Orders

## 2021-10-04 NOTE — Addendum Note (Signed)
Addended by: Gerilyn Nestle on: 10/04/2021 09:22 AM   Modules accepted: Orders

## 2021-10-26 ENCOUNTER — Telehealth: Payer: BC Managed Care – PPO | Admitting: Physician Assistant

## 2021-10-26 DIAGNOSIS — R6889 Other general symptoms and signs: Secondary | ICD-10-CM | POA: Diagnosis not present

## 2021-10-26 MED ORDER — BENZONATATE 100 MG PO CAPS
100.0000 mg | ORAL_CAPSULE | Freq: Three times a day (TID) | ORAL | 0 refills | Status: DC | PRN
Start: 2021-10-26 — End: 2021-11-09

## 2021-10-26 MED ORDER — OSELTAMIVIR PHOSPHATE 75 MG PO CAPS: 75.0000 mg | ORAL_CAPSULE | Freq: Two times a day (BID) | ORAL | 0 refills | Status: DC

## 2021-10-26 NOTE — Patient Instructions (Signed)
  Domingo Sep, thank you for joining Leeanne Rio, PA-C for today's virtual visit.  While this provider is not your primary care provider (PCP), if your PCP is located in our provider database this encounter information will be shared with them immediately following your visit.  Consent: (Patient) Larry Brooks provided verbal consent for this virtual visit at the beginning of the encounter.  Current Medications:  Current Outpatient Medications:    benzonatate (TESSALON) 100 MG capsule, Take 1 capsule (100 mg total) by mouth 3 (three) times daily as needed for cough., Disp: 30 capsule, Rfl: 0   oseltamivir (TAMIFLU) 75 MG capsule, Take 1 capsule (75 mg total) by mouth 2 (two) times daily., Disp: 10 capsule, Rfl: 0   albuterol (VENTOLIN HFA) 108 (90 Base) MCG/ACT inhaler, Inhale 2 puffs into the lungs every 6 (six) hours as needed for wheezing or shortness of breath., Disp: 6.7 g, Rfl: 5   fluticasone (FLONASE) 50 MCG/ACT nasal spray, PLACE 2 SPRAYS INTO BOTH NOSTRILS DAILY, Disp: 16 g, Rfl: 1   Probiotic Product (PROBIOTIC ADVANCED PO), Take 3 capsules by mouth daily., Disp: , Rfl:    Medications ordered in this encounter:  Meds ordered this encounter  Medications   benzonatate (TESSALON) 100 MG capsule    Sig: Take 1 capsule (100 mg total) by mouth 3 (three) times daily as needed for cough.    Dispense:  30 capsule    Refill:  0    Order Specific Question:   Supervising Provider    Answer:   Sabra Heck, BRIAN [[id number]]   oseltamivir (TAMIFLU) 75 MG capsule    Sig: Take 1 capsule (75 mg total) by mouth 2 (two) times daily.    Dispense:  10 capsule    Refill:  0    Order Specific Question:   Supervising Provider    Answer:   Sabra Heck, [place]     *If you need refills on other medications prior to your next appointment, please contact your pharmacy*  Follow-Up: Call back or seek an in-person evaluation if the symptoms worsen or if the condition fails to improve as  anticipated.  Other Instructions Please keep well-hydrated and get plenty of rest.  Start OTC Vitamin D3 1000 units daily and Vitamin C 1000 mg daily.  If you have a humidifier, place in the bedroom and run at night.  You can use Dayquil/Nyquil or Theraflu for symptom relief. I have sent in the Tessalon to help with cough. Use your albuterol inhaler as directed.  Take the Tamiflu sent in as directed.   Feel better soon!   If you have been instructed to have an in-person evaluation today at a local Urgent Care facility, please use the link below. It will take you to a list of all of our available [facility], including address, phone number and hours of operation. Please do not delay care.  [facility]  If you or a family member do not have a primary care provider, use the link below to schedule a visit and establish care. When you choose a [facility] primary care physician or advanced practice provider, you gain a long-term partner in health. Find a Primary Care Provider  Learn more about 's in-office and virtual care options: [facility]

## 2021-10-26 NOTE — Progress Notes (Signed)
Virtual Visit Consent   Larry Brooks, you are scheduled for a virtual visit with a [facility] provider today.     Just as with appointments in the office, your consent must be obtained to participate.  Your consent will be active for this visit and any virtual visit you may have with one of our providers in the next 365 days.     If you have a MyChart account, a copy of this consent can be sent to you electronically.  All virtual visits are billed to your insurance company just like a traditional visit in the office.    As this is a virtual visit, video technology does not allow for your provider to perform a traditional examination.  This may limit your provider's ability to fully assess your condition.  If your provider identifies any concerns that need to be evaluated in person or the need to arrange testing (such as labs, EKG, etc.), we will make arrangements to do so.     Although advances in technology are sophisticated, we cannot ensure that it will always work on either your end or our end.  If the connection with a video visit is poor, the visit may have to be switched to a telephone visit.  With either a video or telephone visit, we are not always able to ensure that we have a secure connection.     I need to obtain your verbal consent now.   Are you willing to proceed with your visit today?    Tanay Banning has provided verbal consent on 10/26/2021 for a virtual visit (video or telephone).   Leeanne Rio, [place]   Date: 10/26/2021 1:16 PM   Virtual Visit via Video Note   I, Leeanne Rio, connected with  Amron Guerrette  ([id number], Jul 09, 1976) on 10/26/21 at  1:00 PM EST by a video-enabled telemedicine application and verified that I am speaking with the correct person using two identifiers.  Location: Patient: Virtual Visit Location Patient: Home Provider: Virtual Visit Location Provider: Home Office   I discussed the limitations of evaluation and  management by telemedicine and the availability of in person appointments. The patient expressed understanding and agreed to proceed.    History of Present Illness: Symon Norwood is a 45 y.o. who identifies as a male who was assigned male at birth, and is being seen today for onset of URI symptoms starting last night. Notes head congestion and dry cough which affected sleep. Today he is continuing with those symptoms along with fatigue, but now with sore throat and more substantial nasal drainage. Denies fever, chills but having substantial body aches. Some windedness with more significant ambulation with a slight wheeze. No major change in appetite. Denies nausea, vomiting or diarrhea. Took a home COVID test which was negative.   HPI: HPI  Problems:  Patient Active Problem List   Diagnosis Date Noted   Preventative health care 07/15/2021   Cardiac risk counseling 12/16/2011   Bronchitis 09/24/2011   ALLERGIC RHINITIS 10/31/2010   GENERALIZED ANXIETY DISORDER 12/08/2008   HYPERLIPIDEMIA 12/19/2007    Allergies: No Known Allergies Medications:  Current Outpatient Medications:    benzonatate (TESSALON) 100 MG capsule, Take 1 capsule (100 mg total) by mouth 3 (three) times daily as needed for cough., Disp: 30 capsule, Rfl: 0   oseltamivir (TAMIFLU) 75 MG capsule, Take 1 capsule (75 mg total) by mouth 2 (two) times daily., Disp: 10 capsule, Rfl: 0   albuterol (VENTOLIN HFA) 108 (90 Base)  MCG/ACT inhaler, Inhale 2 puffs into the lungs every 6 (six) hours as needed for wheezing or shortness of breath., Disp: 6.7 g, Rfl: 5   fluticasone (FLONASE) 50 MCG/ACT nasal spray, PLACE 2 SPRAYS INTO BOTH NOSTRILS DAILY, Disp: 16 g, Rfl: 1   Probiotic Product (PROBIOTIC ADVANCED PO), Take 3 capsules by mouth daily., Disp: , Rfl:   Observations/Objective: Patient is well-developed, well-nourished in no acute distress.  Resting comfortably at home.  Head is normocephalic, atraumatic.  No labored  breathing. Speech is clear and coherent with logical content.  Patient is alert and oriented at baseline.   Assessment and Plan: 1. Flu-like symptoms - benzonatate (TESSALON) 100 MG capsule; Take 1 capsule (100 mg total) by mouth 3 (three) times daily as needed for cough.  Dispense: 30 capsule; Refill: 0 - oseltamivir (TAMIFLU) 75 MG capsule; Take 1 capsule (75 mg total) by mouth 2 (two) times daily.  Dispense: 10 capsule; Refill: 0 Classic symptoms with short duration. Supportive measures, OTC medications and vitamin recommendations reviewed. Discussed pros/cons of antiviral. He would like to proceed with Tamiflu. Rx sent to pharmacy. Quarantine reviewed. Declines need for work note at this time.   Follow Up Instructions: I discussed the assessment and treatment plan with the patient. The patient was provided an opportunity to ask questions and all were answered. The patient agreed with the plan and demonstrated an understanding of the instructions.  A copy of instructions were sent to the patient via MyChart unless otherwise noted below.   The patient was advised to call back or seek an in-person evaluation if the symptoms worsen or if the condition fails to improve as anticipated.  Time:  I spent 10 minutes with the patient via telehealth technology discussing the above problems/concerns.    Leeanne Rio, PA-C

## 2021-11-07 ENCOUNTER — Encounter: Payer: Self-pay | Admitting: Family

## 2021-11-07 NOTE — Telephone Encounter (Signed)
Patient has not received cologuard kit. Can you please check with the company? Tks

## 2021-11-07 NOTE — Addendum Note (Signed)
Addended by: [name] on: 11/07/2021 04:25 PM   Modules accepted: Orders

## 2021-11-09 ENCOUNTER — Ambulatory Visit (INDEPENDENT_AMBULATORY_CARE_PROVIDER_SITE_OTHER): Payer: BC Managed Care – PPO | Admitting: Family

## 2021-11-09 VITALS — BP 119/90 | HR 75 | Temp 98.4°F | Resp 16 | Ht 71.0 in | Wt 190.8 lb

## 2021-11-09 DIAGNOSIS — T7840XA Allergy, unspecified, initial encounter: Secondary | ICD-10-CM | POA: Insufficient documentation

## 2021-11-09 DIAGNOSIS — J40 Bronchitis, not specified as acute or chronic: Secondary | ICD-10-CM | POA: Diagnosis not present

## 2021-11-09 MED ORDER — AZITHROMYCIN 250 MG PO TABS: ORAL_TABLET | ORAL | 0 refills | Status: AC

## 2021-11-09 MED ORDER — PREDNISONE 10 MG PO TABS: ORAL_TABLET | ORAL | 0 refills | Status: DC

## 2021-11-09 MED ORDER — PROMETHAZINE-DM 6.25-15 MG/5ML PO SYRP: 5.0000 mL | ORAL_SOLUTION | Freq: Four times a day (QID) | ORAL | 0 refills | Status: DC | PRN

## 2021-11-09 NOTE — Assessment & Plan Note (Signed)
He would like further allergy testing. Refer to allergist.

## 2021-11-09 NOTE — Progress Notes (Signed)
Subjective:   By signing my name below, I, Shehryar Baig, attest that this documentation has been prepared under the direction and in the presence of Debbrah Alar NP. 11/09/2021    Patient ID: Larry Brooks, male    DOB: 1976/04/08, 45 y.o.   MRN: [id number]  Chief Complaint  Patient presents with   Cough    Complains of Coughing and Congestion lingering onset 2 Weeks    Cough  Patient is in today for a office visit.  He reports having cough and congestion since 10/26/2021. He reports the week after its onset he went out of town for work and reports doing well but found his symptoms resumed after he returned home. He thinks there may be some allergens at his home. He currently has cough and congestion at this time. He is taking ricola, OTC robitussin, vitamin D3 supplements, OTC zyrtec daily PO, albuterol inhaler at night and reports having mild relief.   Allergies- He is requesting to have allergy testing. He is interested in seeing a allergy specialist for further evaluation.    Health Maintenance Due  Topic Date Due   COVID-19 Vaccine (2 - Pfizer series) 05/01/2020   COLONOSCOPY (Pts 45-74yr Insurance coverage will need to be confirmed)  Never done    Past Medical History:  Diagnosis Date   Childhood asthma    Generalized anxiety disorder    Leukopenia    Thrombocytopenia (HCC)     Past Surgical History:  Procedure Laterality Date   TONSILLECTOMY     WISDOM TOOTH EXTRACTION      Family History  Problem Relation Age of Onset   Coronary artery disease Father        status post 4 stents    Cancer Father        prostate   Hyperlipidemia Father    Diabetes type II Other        grandfather   Hyperlipidemia Other        grandfather   Heart attack Paternal Grandfather    Hyperlipidemia Paternal Grandfather    Colon cancer Neg Hx     Social History   Socioeconomic History   Marital status: Married    Spouse name: Not on file   Number of children: Not  on file   Years of education: Not on file   Highest education level: Not on file  Occupational History   Not on file  Tobacco Use   Smoking status: Never   Smokeless tobacco: Never   Tobacco comments:    never used tobacco  Vaping Use   Vaping Use: Never used  Substance and Sexual Activity   Alcohol use: Yes    Comment: very rare-social   Drug use: No   Sexual activity: Yes    Partners: Female  Other Topics Concern   Not on file  Social History Narrative   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 6.7 g 5   Probiotic Product (PROBIOTIC ADVANCED PO) Take 3 capsules by mouth daily.     fluticasone (FLONASE) 50 MCG/ACT nasal spray PLACE 2 SPRAYS INTO BOTH NOSTRILS DAILY 16 g 1   oseltamivir (TAMIFLU) 75 MG capsule Take 1 capsule (75 mg total) by mouth 2 (two) times daily. 10 capsule 0   benzonatate (TESSALON) 100 MG capsule Take 1 capsule (100 mg total) by mouth 3 (three) times daily as needed for cough. (Patient not taking: Reported on 11/09/2021) 30 capsule 0   No facility-administered medications prior to visit.    No Known Allergies  Review of Systems  HENT:  Positive for congestion.   Respiratory:  Positive for cough.       Objective:    Physical Exam Constitutional:      General: He is not in acute distress.    Appearance: Normal appearance. He is not ill-appearing.  HENT:     Head:  Normocephalic and atraumatic.     Right Ear: External ear normal. There is impacted cerumen.     Left Ear: External ear normal. There is impacted cerumen.  Eyes:     Extraocular Movements: Extraocular movements intact.     Pupils: Pupils are equal, round, and reactive to light.  Cardiovascular:     Rate and Rhythm: Normal rate and regular rhythm.     Heart sounds: Normal heart sounds. No murmur heard.   No gallop.  Pulmonary:     Effort: Pulmonary effort is normal. No respiratory distress.     Breath sounds: Normal breath sounds. No wheezing or rales.  Skin:    General: Skin is warm and dry.  Neurological:     Mental Status: He is alert and oriented to person, place, and time.  Psychiatric:        Behavior: Behavior normal.        Judgment: Judgment normal.    BP 119/90 (BP Location: Right Arm, Patient Position: Sitting, Cuff Size: Small)    Pulse 75    Temp 98.4 F (36.9 C) (Oral)    Resp 16    Ht 5' 11"  (1.803 m)    Wt 190 lb 12.8 oz (86.5 kg)    SpO2 100%    BMI 26.61 kg/m  Wt Readings from Last 3 Encounters:  11/09/21 190 lb 12.8 oz (86.5 kg)  07/15/21 182 lb (82.6 kg)  12/24/20 183 lb 6.4 oz (83.2 kg)       Assessment & Plan:   Problem List Items Addressed This Visit       Unprioritized   Bronchitis    New. Since he is responding to albuterol, and he has had such a long duration of symptoms, will plan treatment as below:  Please begin prednisone and azithromycin.  Continue albuterol every 6 hours as needed.  Call if symptoms worsen or if symptoms do not improve.       Allergies - Primary    He would like further allergy testing. Refer to allergist.       Relevant Orders   Ambulatory referral to Allergy     Meds ordered this encounter  Medications   predniSONE (DELTASONE) 10 MG tablet    Sig: 4 tabs by mouth once daily for 2 days, then 3 tabs daily x 2 days, then 2 tabs daily x 2 days, then 1 tab daily x 2 days    Dispense:  20 tablet  Refill:  0     Order Specific Question:   Supervising Provider    Answer:   Penni Homans A [[id number]]   azithromycin (ZITHROMAX) 250 MG tablet    Sig: Take 2 tablets on day 1, then 1 tablet daily on days 2 through 5    Dispense:  6 tablet    Refill:  0    Order Specific Question:   Supervising Provider    Answer:   Penni Homans A [[id number]]   promethazine-dextromethorphan (PROMETHAZINE-DM) 6.25-15 MG/5ML syrup    Sig: Take 5 mLs by mouth 4 (four) times daily as needed for cough.    Dispense:  118 mL    Refill:  0    Order Specific Question:   Supervising Provider    Answer:   Penni Homans A [[id number]]    I, Debbrah Alar NP, personally preformed the services described in this documentation.  All medical record entries made by the scribe were at my direction and in my presence.  I have reviewed the chart and discharge instructions (if applicable) and agree that the record reflects my personal performance and is accurate and complete. 11/09/2021   I,Shehryar Baig,acting as a Education administrator for Nance Pear, NP.,have documented all relevant documentation on the behalf of Nance Pear, NP,as directed by  Nance Pear, NP while in the presence of Nance Pear, NP.   Nance Pear, NP

## 2021-11-09 NOTE — Patient Instructions (Signed)
Please begin prednisone and azithromycin.  Continue albuterol every 6 hours as needed.  Call if symptoms worsen or if symptoms do not improve.

## 2021-11-09 NOTE — Assessment & Plan Note (Signed)
New. Since he is responding to albuterol, and he has had such a long duration of symptoms, will plan treatment as below:  Please begin prednisone and azithromycin.  Continue albuterol every 6 hours as needed.  Call if symptoms worsen or if symptoms do not improve.

## 2021-11-15 ENCOUNTER — Encounter: Payer: BC Managed Care – PPO | Admitting: Gastroenterology

## 2021-11-23 ENCOUNTER — Encounter: Payer: Self-pay | Admitting: Family

## 2021-11-23 DIAGNOSIS — Z1211 Encounter for screening for malignant neoplasm of colon: Secondary | ICD-10-CM | POA: Diagnosis not present

## 2021-11-23 LAB — COLOGUARD: Cologuard: NEGATIVE

## 2021-12-01 LAB — COLOGUARD: COLOGUARD: NEGATIVE

## 2021-12-15 ENCOUNTER — Encounter: Payer: BC Managed Care – PPO | Admitting: Gastroenterology

## 2021-12-19 NOTE — Progress Notes (Signed)
NEW PATIENT Date of Service/Encounter:  12/22/21 Referring provider: Debbrah Alar, NP Primary care provider: Debbrah Alar, NP  Subjective:  Larry Brooks is a 46 y.o. male with a PMHx of hyperlipidemia, anxiety s/p tonsillectomy and adenoidectomy presenting today for evaluation of chronic rhinitis History obtained from: chart review and patient.   Moved to [place] 15 years ago, prior to that has lived in different areas of the [place].  Chronic rhinitis: started in childhood Symptoms include:  rarely congestion, post nasal drainage, and sneezing, throat drainage and hoarseness Occurs year-round; worse at season changes Treatments tried: Zyrtec daily Previous allergy testing:  yes-molds, trees, grasses History of reflux/heartburn: no  History of asthma:  Asthma worse when he was younger. Flares mostly when sick No controller meds, uses  Recent treatment of bronchitis with prednisone and azithromycin and albuterol PRN (11/09/21 with PCP per chart review).  This is becoming a pattern; almost 2-3 times per year.  Getting antibiotics 3-4 times per year-usually for sinus infection that goes to bronchitis. Has had COVID 19 infection twice, both times mild symptoms. Has had pneumonia twice - hospitalized in [facility]; has had as an adult 1-2 times but doesn't remember when. No history of skin infection.  No recurrent ear infections.  No hospitalizations outside of high school.   Increase infections in number the past 2-3 years.    Almonds sensitivity-gets mouth sores if eats too many, so avoids for the most part.  30 years ago was told he was positive to soy, but he eats now and doesn't avoid now.    Other allergy screening: Medication allergy: no Urticaria: no Eczema:no History of recurrent infections suggestive of immunodeficency:  yes-recurrent sinusitis (3-4 per year over the past 2-3 years + 2-3 lifetime pneumonias, one requiring hospitalization Vaccinations are up  to date.   Past Medical History: Past Medical History:  Diagnosis Date   Childhood asthma    Generalized anxiety disorder    Leukopenia    Thrombocytopenia ([place])    Medication List:  Current Outpatient Medications  Medication Sig Dispense Refill   albuterol (VENTOLIN HFA) 108 (90 Base) MCG/ACT inhaler Inhale 2 puffs into the lungs every 6 (six) hours as needed for wheezing or shortness of breath. 6.7 g 5   cetirizine (ZYRTEC) 10 MG tablet Take 10 mg by mouth daily.     Fluticasone Propionate,sensor, (ARMONAIR DIGIHALER) 113 MCG/ACT AEPB Inhale 1 puff into the lungs in the morning and at bedtime. 1 each 3   ipratropium (ATROVENT) 0.03 % nasal spray Place 2 sprays into both nostrils 3 (three) times daily as needed for rhinitis. 30 mL 5   Probiotic Product (PROBIOTIC ADVANCED PO) Take 3 capsules by mouth daily.     No current facility-administered medications for this visit.   Known Allergies:  No Known Allergies Past Surgical History: Past Surgical History:  Procedure Laterality Date   TONSILLECTOMY     WISDOM TOOTH EXTRACTION     Family History: Family History  Problem Relation Age of Onset   Coronary artery disease Father        status post 4 stents    Cancer Father        prostate   Hyperlipidemia Father    COPD Maternal Grandmother    Heart attack Paternal Grandfather    Hyperlipidemia Paternal Grandfather    Diabetes type II Other        grandfather   Hyperlipidemia Other        grandfather   Allergic  rhinitis Son    Food Allergy Son        dairy, peanut   Food Allergy Son        peanut   Allergic rhinitis Son    Colon cancer Neg Hx    Asthma Neg Hx    Immunodeficiency Neg Hx    Angioedema Neg Hx    Atopy Neg Hx    Eczema Neg Hx    Urticaria Neg Hx    Social History: [place] lives in a house built 30 years ago, no water damage, carpet floors, heat pump heating, central ac, no pets, no cockroaches, using DM protection on bedding, no smoke exposure, works as  Retail banker, no hobbies exposing him to dust, chemicals or fumes, home not [place] interstate/industrial area .   ROS:  All other systems negative except as noted per HPI.  Objective:  Blood pressure 112/62, pulse 85, temperature 98.6 F (37 C), temperature source Temporal, resp. rate 18, SpO2 100 %. There is no height or weight on file to calculate BMI. Physical Exam:  General Appearance:  Alert, cooperative, no distress, appears stated age  Head:  Normocephalic, without obvious abnormality, atraumatic  Eyes:  Conjunctiva clear, EOM's intact  Nose: Nares normal, hypertrophic turbinates, normal mucosa, and no visible anterior polyps  Throat: Lips, tongue normal; teeth and gums normal, normal posterior oropharynx and + cobblestoning  Neck: Supple, symmetrical  Lungs:   clear to auscultation bilaterally, Respirations unlabored, no coughing  Heart:  regular rate and rhythm and no murmur, Appears well perfused  Extremities: No edema  Skin: Skin color, texture, turgor normal, no rashes or lesions on visualized portions of skin  Neurologic: No gross deficits     Diagnostics: Spirometry:  Tracings reviewed. His effort: Good reproducible efforts. FVC: 3.94L (pre), 3.85L  (post) FEV1: 3.17L, 75% predicted (pre), 3.13L, 75% predicted (post) FEV1/FVC ratio: 100% (pre), 101% (post) Interpretation: Spirometry consistent with possible restrictive disease (scooping present on flow graph) without bronchodilator response  Skin Testing: Environmental allergy panel and almond Adequate controls. Results discussed with patient/family.  Airborne Adult Perc - 12/22/21 1048     Time Antigen Placed 1048    Allergen Manufacturer Lavella Hammock    Location Back    Number of Test 59    1. Control-Buffer 50% Glycerol --   2 x 2   2. Control-Histamine 1 mg/ml 3+    3. Albumin saline Negative    4. Cordova Negative    5. [place] 2+    6. Johnson Negative    7. Hayfield 4+    8. Meadow Fescue 3+     9. Perennial Rye 4+    10. Sweet Vernal Negative    11. Timothy 4+    12. Cocklebur Negative    13. Burweed Marshelder Negative    14. Ragweed, short 4+    15. Ragweed, Giant 2+    16. Plantain,  English Negative    17. Lamb's Quarters Negative    18. Sheep Sorrell Negative    19. Rough Pigweed Negative    20. Marsh Elder, Rough Negative    21. Mugwort, Common 2+    22. Ash mix Negative    23. Birch mix Negative    24. Beech American Negative    25. Box, Elder Negative    26. Cedar, red Negative    27. Cottonwood, [place] Negative    28. Elm mix Negative    29. Hickory 3+    30. Maple mix  Negative    31. Oak, [place] mix 3+    32. Pecan Pollen Negative    33. Pine mix Negative    34. Sycamore Eastern Negative    35. Lastrup, Black Pollen 2+    36. Alternaria alternata 3+    37. Cladosporium Herbarum Negative    38. Aspergillus mix Negative    39. Penicillium mix 3+    40. Bipolaris sorokiniana (Helminthosporium) Negative    41. Drechslera spicifera (Curvularia) 3+    42. Mucor plumbeus Negative    43. Fusarium moniliforme 4+    44. Aureobasidium pullulans (pullulara) Negative    45. Rhizopus oryzae Negative    46. Botrytis cinera 3+    47. Epicoccum nigrum 3+    48. Phoma betae 3+    49. Candida Albicans Negative    50. Trichophyton mentagrophytes Negative    51. Mite, D Farinae  5,000 AU/ml 2+    52. Mite, D Pteronyssinus  5,000 AU/ml 2+    53. Cat Hair 10,000 BAU/ml 2+    54.  Dog Epithelia 2+    55. Mixed Feathers 2+    56. Horse Epithelia Negative    57. Cockroach, German 2+    58. Mouse 2+    59. Tobacco Leaf Negative             Food Adult Perc - 12/22/21 1000     Time Antigen Placed [place]    Location Back    Number of allergen test 1    13. Almond --   2 x 2          positive to grasses, weeds, trees, indoor/outdoor molds, dust mites, cat, dogs, mixed feathers, cockroach, mouse, almond 2x2 (negative control  also 2x2)  Allergy testing results were read and interpreted by myself, documented by clinical staff.  Assessment and Plan   Patient Instructions  Chronic Rhinitis -seasonal and perennial allergic rhinitis: - allergy testing today was positive to grasses, weeds, trees, indoor/outdoor molds, dust mites, cat, dogs, mixed feathers, cockroach, mouse - allergen avoidance as below - consider allergy shots as long term control of your symptoms by teaching your immune system to be more tolerant of your allergy triggers - Start Atrovent (Ipratropium Bromide) 1-2 sprays in each nostril up to 3 times a day as needed for runny nose/post nasal drip/drainage.  Use less frequently if airway gets too dry. - Continue over the counter antihistamine daily or daily as needed.   -Your options include Zyrtec (Cetirizine) 59m, Claritin (Loratadine) 188m Allegra (Fexofenadine) 18059mor Xyzal (Levocetirinze) 5mg35mecurrent sinusitis:  - potentially related to uncontrolled asthma and allergies - labs today to screen humoral immune system: CBCd, antibody levels (A, M, G), diptheria/strep/tetanus vaccine titers - will contact you once all results available (may take 2-3 weeks)  Mild Persistent Asthma: - your lung testing today looked okay and did not show significant bronchodilator response, but you are currently asymptomatic - Controller Inhaler: To be used at start of respiratory illness:  Start Armonair 113, 1 puff twice a day; Use In Block Therapy-Start if having respiratory symptoms.  Use for at least 1 week or until symptoms resolve. - Rinse mouth out after use - Rescue Inhaler: Albuterol (Proair/Ventolin) 2 puffs . Use  every 4-6 hours as needed for chest tightness, wheezing, or coughing.  Can also use 15 minutes prior to exercise if you have symptoms with activity. - Asthma is not controlled if:  - Symptoms  are occurring >2 times a week OR  - >2 times a month nighttime awakenings  - You are requiring  systemic steroids (prednisone/steroid injections) more than once per year  - Your require hospitalization for your asthma.  - Please call the clinic to schedule a follow up if these symptoms arise  Concern for Almond Allergy - skin testing today was borderline; will confirm with blood test - avoid almonds in the meantime  Follow-up in 3 months, sooner if needed. It was a pleasure meeting you in clinic today!  Sigurd Sos, MD [facility]     This note in its entirety was forwarded to the Provider who requested this consultation.  Thank you for your kind referral. I appreciate the opportunity to take part in [place] care. Please do not hesitate to contact me with questions.  Sincerely,  Sigurd Sos, MD [facility]

## 2021-12-22 ENCOUNTER — Other Ambulatory Visit: Payer: Self-pay

## 2021-12-22 ENCOUNTER — Ambulatory Visit (INDEPENDENT_AMBULATORY_CARE_PROVIDER_SITE_OTHER): Payer: BC Managed Care – PPO | Admitting: Internal Medicine

## 2021-12-22 ENCOUNTER — Encounter: Payer: Self-pay | Admitting: Internal Medicine

## 2021-12-22 VITALS — BP 112/62 | HR 85 | Temp 98.6°F | Resp 18

## 2021-12-22 DIAGNOSIS — J4530 Mild persistent asthma, uncomplicated: Secondary | ICD-10-CM | POA: Diagnosis not present

## 2021-12-22 DIAGNOSIS — T781XXA Other adverse food reactions, not elsewhere classified, initial encounter: Secondary | ICD-10-CM

## 2021-12-22 DIAGNOSIS — J329 Chronic sinusitis, unspecified: Secondary | ICD-10-CM

## 2021-12-22 DIAGNOSIS — J310 Chronic rhinitis: Secondary | ICD-10-CM

## 2021-12-22 MED ORDER — IPRATROPIUM BROMIDE 0.03 % NA SOLN: 2.0000 | Freq: Three times a day (TID) | NASAL | 5 refills | Status: AC | PRN

## 2021-12-22 MED ORDER — ARMONAIR DIGIHALER 113 MCG/ACT IN AEPB: 1.0000 | INHALATION_SPRAY | Freq: Two times a day (BID) | RESPIRATORY_TRACT | 3 refills | Status: DC

## 2021-12-22 NOTE — Patient Instructions (Addendum)
Chronic Rhinitis -seasonal and perennial allergic rhinitis: - allergy testing today was positive to grasses, weeds, trees, indoor/outdoor molds, dust mites, cat, dogs, mixed feathers, cockroach, mouse - allergen avoidance as below - consider allergy shots as long term control of your symptoms by teaching your immune system to be more tolerant of your allergy triggers - Start Atrovent (Ipratropium Bromide) 1-2 sprays in each nostril up to 3 times a day as needed for runny nose/post nasal drip/drainage.  Use less frequently if airway gets too dry. - Continue over the counter antihistamine daily or daily as needed.   -Your options include Zyrtec (Cetirizine) 51m, Claritin (Loratadine) 131m Allegra (Fexofenadine) 18064mor Xyzal (Levocetirinze) 5mg37mecurrent sinusitis:  - labs today to screen humoral immune system: CBCd, antibody levels (A, M, G), diptheria/strep/tetanus vaccine titers - will contact you once all results available (may take 2-3 weeks)  Mild Persistent Asthma: - your lung testing today looked okay and did not show significant bronchodilator response, but you are currently asymptomatic - Controller Inhaler: To be used at start of respiratory illness:  Start Armonair 113, 1 puff twice a day; Use In Block Therapy-Start if having respiratory symptoms.  Use for at least 1 week or until symptoms resolve. - Rinse mouth out after use - Rescue Inhaler: Albuterol (Proair/Ventolin) 2 puffs . Use  every 4-6 hours as needed for chest tightness, wheezing, or coughing.  Can also use 15 minutes prior to exercise if you have symptoms with activity. - Asthma is not controlled if:  - Symptoms are occurring >2 times a week OR  - >2 times a month nighttime awakenings  - You are requiring systemic steroids (prednisone/steroid injections) more than once per year  - Your require hospitalization for your asthma.  - Please call the clinic to schedule a follow up if these symptoms arise  Concern for  Almond Allergy - skin testing today was borderline; will confirm with blood test - avoid almonds in the meantime  Follow-up in 3 months, sooner if needed. It was a pleasure meeting you in clinic today!  ErinSigurd Sos [facility]

## 2021-12-23 ENCOUNTER — Encounter: Payer: Self-pay | Admitting: Internal Medicine

## 2021-12-23 DIAGNOSIS — T781XXA Other adverse food reactions, not elsewhere classified, initial encounter: Secondary | ICD-10-CM | POA: Diagnosis not present

## 2021-12-23 DIAGNOSIS — J329 Chronic sinusitis, unspecified: Secondary | ICD-10-CM | POA: Diagnosis not present

## 2021-12-26 ENCOUNTER — Encounter: Payer: Self-pay | Admitting: Internal Medicine

## 2021-12-28 LAB — F020-IGE ALMOND: F020-IgE Almond: <0.1 kU/L

## 2022-01-03 LAB — IGG, IGA, IGM
IgA/Immunoglobulin A, Serum: 248 mg/dL (ref 90–386)
IgG (Immunoglobin G), Serum: 934 mg/dL (ref 603–1613)
IgM (Immunoglobulin M), Srm: 125 mg/dL (ref 20–172)

## 2022-01-03 LAB — STREP PNEUMONIAE 23 SEROTYPES IGG
Pneumo Ab Type 1*: 2.6 ug/mL (ref >1.3)
Pneumo Ab Type 12 (12F)*: 1.3 ug/mL — ABNORMAL LOW (ref >1.3)
Pneumo Ab Type 14*: 5 ug/mL (ref >1.3)
Pneumo Ab Type 17 (17F)*: 7 ug/mL (ref >1.3)
Pneumo Ab Type 19 (19F)*: 7 ug/mL (ref >1.3)
Pneumo Ab Type 2*: 14.4 ug/mL (ref >1.3)
Pneumo Ab Type 20*: 21.2 ug/mL (ref >1.3)
Pneumo Ab Type 22 (22F)*: 4.6 ug/mL (ref >1.3)
Pneumo Ab Type 23 (23F)*: 2.7 ug/mL (ref >1.3)
Pneumo Ab Type 26 (6B)*: 3.2 ug/mL (ref >1.3)
Pneumo Ab Type 3*: 0.9 ug/mL — ABNORMAL LOW (ref >1.3)
Pneumo Ab Type 34 (10A)*: 9.7 ug/mL (ref >1.3)
Pneumo Ab Type 4*: 1.3 ug/mL — ABNORMAL LOW (ref >1.3)
Pneumo Ab Type 43 (11A)*: 2.9 ug/mL (ref >1.3)
Pneumo Ab Type 5*: 7.5 ug/mL (ref >1.3)
Pneumo Ab Type 51 (7F)*: 1.7 ug/mL (ref >1.3)
Pneumo Ab Type 54 (15B)*: 0.7 ug/mL — ABNORMAL LOW (ref >1.3)
Pneumo Ab Type 56 (18C)*: 1.6 ug/mL (ref >1.3)
Pneumo Ab Type 57 (19A)*: 6.3 ug/mL (ref >1.3)
Pneumo Ab Type 68 (9V)*: 3.2 ug/mL (ref >1.3)
Pneumo Ab Type 70 (33F)*: 6.1 ug/mL (ref >1.3)
Pneumo Ab Type 8*: 15.4 ug/mL (ref >1.3)
Pneumo Ab Type 9 (9N)*: 7.2 ug/mL (ref >1.3)

## 2022-01-03 LAB — CBC WITH DIFFERENTIAL
Basophils Absolute: 0 10*3/uL (ref 0.0–0.2)
Basos: 1 %
EOS (ABSOLUTE): 0.1 10*3/uL (ref 0.0–0.4)
Eos: 2 %
Hematocrit: 44.2 % (ref 37.5–51.0)
Hemoglobin: 15.8 g/dL (ref 13.0–17.7)
Immature Grans (Abs): 0 10*3/uL (ref 0.0–0.1)
Immature Granulocytes: 0 %
Lymphocytes Absolute: 1.2 10*3/uL (ref 0.7–3.1)
Lymphs: 28 %
MCH: 32 pg (ref 26.6–33.0)
MCHC: 35.7 g/dL (ref 31.5–35.7)
MCV: 90 fL (ref 79–97)
Monocytes Absolute: 0.3 10*3/uL (ref 0.1–0.9)
Monocytes: 7 %
Neutrophils Absolute: 2.7 10*3/uL (ref 1.4–7.0)
Neutrophils: 62 %
RBC: 4.94 x10E6/uL (ref 4.14–5.80)
RDW: 12.5 % (ref 11.6–15.4)
WBC: 4.3 10*3/uL (ref 3.4–10.8)

## 2022-01-03 LAB — COMPLEMENT, TOTAL: Compl, Total (CH50): >60 U/mL (ref >41)

## 2022-01-03 LAB — DIPHTHERIA / TETANUS ANTIBODY PANEL
Diphtheria Ab: 0.17 IU/mL (ref <0.10)
Tetanus Ab, IgG: 2.45 IU/mL (ref <0.10)

## 2022-01-04 NOTE — Progress Notes (Signed)
Please let Mr. Kelsay know the following:   Your lab results have all returned. Your immune evaluation is very reassuring.  No need for further work-up of your immune system. Your almond testing was negative.  Based on your history, would consider your symptoms more likely secondary to an intolerance.  Avoidance is optional but advised to avoid unwanted symptoms.   We look forward to seeing you in follow-up.   Please let us know if you have any questions.  Sincerely, Sigurd Sos, MD [facility]

## 2022-03-24 NOTE — Progress Notes (Signed)
? ?FOLLOW UP ?Date of Service/Encounter:  03/27/22 ? ? ?Subjective:  ?Larry Brooks (DOB: [birth year]) is a 46 y.o. male who returns to the [facility] on 03/27/2022 in re-evaluation of the following: Allergic rhinitis, recurrent sinusitis, mild persistent asthma, concern for food allergy ?History obtained from: chart review and patient. ? ?For Review, LV was on 12/22/21  with Dr.Bernd Crom seen for initial visit for allergic rhinitis.  At that visit, we discussed medical management of allergies, and consideration of injections if no or minimal improvement. ? ?Pertinent History/Diagnostics:  ?- Asthma: mild persistent, active during respiratory illness ? - pre/post spirometry (12/22/21) while asymptomatic: ratio 100%, 75% FEV1 (pre), 75% FEV1 (post)-nonobstructive, no improvement with bronchodilator, scooping noted on expiratory loop ?- Allergic Rhinitis:  ? - SPT environmental panel (12/22/21): positive to grasses, weeds, trees, indoor/outdoor molds, dust mites, cat, dogs, mixed feathers, cockroach, mouse ? - interest in AIT-potentially if symptoms worsen, but not at this time ?- Food Intolerance (almonds) ? - Hx of reaction: -gets mouth sores if eats too many, so avoids for the most part ? - SPT select foods (almond): 2 x 2 (same as negative control) ? - labs: igE almond < 0.10  ?- Recurrent sinusitis: Getting antibiotics 3-4 times per year-usually for sinus infection that goes to bronchitis, pneumonia twice - hospitalized in [place], No history of skin infection.  No recurrent ear infections.  No hospitalizations outside of high school.   Increase infections in number the past 2-3 years.   ? - immune evaluation labs: CH50, IgM, IgA, IgG, CBCd, diphteria, tetanus and strep titers all within normal limits ? - no previous sinus imaging, last visit with ENT when he was a small child ? ?Today presents for follow-up. ?He has been doing well since his last visit.   ?Somewhere between Feb and March, he  started his [employer] and was able to avoid needing antibiotics because he could tell that his allergy symptoms were increasing. ?Did have a trip to [place] and forgot to pack his allergy medications. He started feeling like he was going to have a sinus infection but was able to find some zyrtec at [place] airport which helped him significantly.  He started his ArmonAir when he got home.  It does seem to dry his throat out.  He is not washing his mouth out after use. ?He feels that with zyrtec as needed and ArmonAir in block therapy, he is overall controlled.  He is not sure that allergy injections would be of benefit at this time because of the time commitment and cost.  He would consider should he not be controlled on current regimen.  ? ? ? ?Allergies as of 03/27/2022   ?No Known Allergies ?  ? ?  ?Medication List  ?  ? ?  ? Accurate as of Mar 27, 2022 12:27 PM. If you have any questions, ask your nurse or doctor.  ?  ?  ? ?  ? ?albuterol 108 (90 Base) MCG/ACT inhaler ?Commonly known as: VENTOLIN HFA ?Inhale 2 puffs into the lungs every 6 (six) hours as needed for wheezing or shortness of breath. ?  ?ArmonAir Digihaler 113 MCG/ACT Aepb ?Generic drug: Fluticasone Propionate(sensor) ?Inhale 1 puff into the lungs in the morning and at bedtime. ?  ?cetirizine 10 MG tablet ?Commonly known as: ZYRTEC ?Take 10 mg by mouth daily. ?  ?ipratropium 0.03 % nasal spray ?Commonly known as: ATROVENT ?Place 2 sprays into both nostrils 3 (three) times daily as needed  for rhinitis. ?  ?PROBIOTIC ADVANCED PO ?Take 3 capsules by mouth daily. ?  ? ?  ? ?Past Medical History:  ?Diagnosis Date  ? Childhood asthma   ? Generalized anxiety disorder   ? Leukopenia   ? Thrombocytopenia ([place])   ? ?Past Surgical History:  ?Procedure Laterality Date  ? TONSILLECTOMY    ? WISDOM TOOTH EXTRACTION    ? ?Otherwise, there have been no changes to his past medical history, surgical history, family history, or social history. ? ?ROS: All others negative  except as noted per HPI.  ? ?Objective:  ?BP 124/82   Pulse 67   Temp 97.9 ?F (36.6 ?C)   Resp 18   Ht 5' 10.47" (1.79 m)   Wt 189 lb (85.7 kg)   SpO2 100%   BMI 26.76 kg/m?  ?Body mass index is 26.76 kg/m?Marland Kitchen ?Physical Exam: ?General Appearance:  Alert, cooperative, no distress, appears stated age  ?Head:  Normocephalic, without obvious abnormality, atraumatic  ?Eyes:  Conjunctiva clear, EOM's intact  ?Nose: Nares normal, hypertrophic turbinates, normal mucosa, no visible anterior polyps, and septum midline  ?Throat: Lips, tongue normal; teeth and gums normal, normal posterior oropharynx  ?Neck: Supple, symmetrical  ?Lungs:   clear to auscultation bilaterally, Respirations unlabored, no coughing  ?Heart:  regular rate and rhythm and no murmur, Appears well perfused  ?Extremities: No edema  ?Skin: Skin color, texture, turgor normal, no rashes or lesions on visualized portions of skin  ?Neurologic: No gross deficits  ? ?Assessment/Plan  ?Overall his allergy and asthma symptoms are controlled with current plan.  He has not had any sinus infections requiring antibiotics since last visit.  Hold on any sinus imaging and/or starting allergy injections at this time.  He would consider allergy injections should symptoms become uncontrolled with current plan. ? ?Seasonal and perennial allergic rhinitis: ?- allergen avoidance towards grasses, weeds, trees, indoor/outdoor molds, dust mites, cat, dogs, mixed feathers, cockroach, mouse ?- consider allergy shots as long term control of your symptoms by teaching your immune system to be more tolerant of your allergy triggers ?- Continue Atrovent (Ipratropium Bromide) 1-2 sprays in each nostril up to 3 times a day as needed for runny nose/post nasal drip/drainage.  Use less frequently if airway gets too dry. ?- Continue over the counter antihistamine daily or daily as needed.   ?-Your options include Zyrtec (Cetirizine) 24m, Claritin (Loratadine) 14m Allegra (Fexofenadine)  18074mor Xyzal (Levocetirinze) 5mg25m?Recurrent sinusitis:  ?- immune labs were reassuring, no further immune evaluation indicated ?- continue to keep track of sinus infections and how often you are needing antibiotics, may consider sinus imaging in the future if this continues to be an issue ? ?Mild Persistent Asthma: ?- Controller Inhaler: To be used at start of respiratory illness:  Start Armonair 113, 1 puff twice a day; Use In Block Therapy-Start if having respiratory symptoms.  Use for at least 1 week or until symptoms resolve. ?- Rinse mouth out after use ?- Rescue Inhaler: Albuterol (Proair/Ventolin) 2 puffs . Use  every 4-6 hours as needed for chest tightness, wheezing, or coughing.  Can also use 15 minutes prior to exercise if you have symptoms with activity. ?- Asthma is not controlled if: ? - Symptoms are occurring >2 times a week OR ? - >2 times a month nighttime awakenings ? - You are requiring systemic steroids (prednisone/steroid injections) more than once per year ? - Your require hospitalization for your asthma. ? - Please call the clinic to  schedule a follow up if these symptoms arise ? ?Almond Intolerance ?- skin testing was borderline; blood test was negative ?- suspect almond intolerance based on history, recommend avoidance ? ?Follow-up in 3-4 months, sooner if needed. ?It was a pleasure seeing you again in clinic today! ? ?Sigurd Sos, MD  ?[facility] ? ? ? ? ? ? ?

## 2022-03-27 ENCOUNTER — Ambulatory Visit (INDEPENDENT_AMBULATORY_CARE_PROVIDER_SITE_OTHER): Payer: BC Managed Care – PPO | Admitting: Internal Medicine

## 2022-03-27 ENCOUNTER — Encounter: Payer: Self-pay | Admitting: Internal Medicine

## 2022-03-27 VITALS — BP 124/82 | HR 67 | Temp 97.9°F | Resp 18 | Ht 70.47 in | Wt 189.0 lb

## 2022-03-27 DIAGNOSIS — J4530 Mild persistent asthma, uncomplicated: Secondary | ICD-10-CM | POA: Diagnosis not present

## 2022-03-27 DIAGNOSIS — T781XXA Other adverse food reactions, not elsewhere classified, initial encounter: Secondary | ICD-10-CM | POA: Insufficient documentation

## 2022-03-27 DIAGNOSIS — J302 Other seasonal allergic rhinitis: Secondary | ICD-10-CM | POA: Insufficient documentation

## 2022-03-27 DIAGNOSIS — T7819XA Other adverse food reactions, not elsewhere classified, initial encounter: Secondary | ICD-10-CM | POA: Insufficient documentation

## 2022-03-27 DIAGNOSIS — J329 Chronic sinusitis, unspecified: Secondary | ICD-10-CM | POA: Insufficient documentation

## 2022-03-27 DIAGNOSIS — J3089 Other allergic rhinitis: Secondary | ICD-10-CM

## 2022-03-27 MED ORDER — ALBUTEROL SULFATE HFA 108 (90 BASE) MCG/ACT IN AERS: 2.0000 | INHALATION_SPRAY | Freq: Four times a day (QID) | RESPIRATORY_TRACT | 1 refills | Status: DC | PRN

## 2022-03-27 MED ORDER — ARMONAIR DIGIHALER 113 MCG/ACT IN AEPB: 1.0000 | INHALATION_SPRAY | Freq: Two times a day (BID) | RESPIRATORY_TRACT | 3 refills | Status: DC

## 2022-03-27 NOTE — Patient Instructions (Addendum)
Seasonal and perennial allergic rhinitis: ?- allergen avoidance towards grasses, weeds, trees, indoor/outdoor molds, dust mites, cat, dogs, mixed feathers, cockroach, mouse ?- consider allergy shots as long term control of your symptoms by teaching your immune system to be more tolerant of your allergy triggers ?- Continue Atrovent (Ipratropium Bromide) 1-2 sprays in each nostril up to 3 times a day as needed for runny nose/post nasal drip/drainage.  Use less frequently if airway gets too dry. ?- Continue over the counter antihistamine daily or daily as needed.   ?-Your options include Zyrtec (Cetirizine) 82m, Claritin (Loratadine) 116m Allegra (Fexofenadine) 18066mor Xyzal (Levocetirinze) 5mg67m?Recurrent sinusitis:  ?- immune labs were reassuring, no further immune evaluation indicated ?- continue to keep track of sinus infections and how often you are needing antibiotics, may consider sinus imaging in the future if this continues to be an issue ? ?Mild Persistent Asthma: ?- Controller Inhaler: To be used at start of respiratory illness:  Start Armonair 113, 1 puff twice a day; Use In Block Therapy-Start if having respiratory symptoms.  Use for at least 1 week or until symptoms resolve. ?- Rinse mouth out after use ?- Rescue Inhaler: Albuterol (Proair/Ventolin) 2 puffs . Use  every 4-6 hours as needed for chest tightness, wheezing, or coughing.  Can also use 15 minutes prior to exercise if you have symptoms with activity. ?- Asthma is not controlled if: ? - Symptoms are occurring >2 times a week OR ? - >2 times a month nighttime awakenings ? - You are requiring systemic steroids (prednisone/steroid injections) more than once per year ? - Your require hospitalization for your asthma. ? - Please call the clinic to schedule a follow up if these symptoms arise ? ?Almond Intolerance ?- skin testing was borderline; blood test was negative ?- suspect almond intolerance based on history, recommend  avoidance ? ?Follow-up in 3 months, sooner if needed. ?It was a pleasure seeing you again in clinic today! ? ?Larry Brooks ?[facility] ? ?Reducing Pollen Exposure ? ?The American Academy of Allergy, Asthma and Immunology suggests the following steps to reduce your exposure to pollen during allergy seasons. ?   ?Do not hang sheets or clothing out to dry; pollen may collect on these items. ?Do not mow lawns or spend time around freshly cut grass; mowing stirs up pollen. ?Keep windows closed at night.  Keep car windows closed while driving. ?Minimize morning activities outdoors, a time when pollen counts are usually at their highest. ?Stay indoors as much as possible when pollen counts or humidity is high and on windy days when pollen tends to remain in the air longer. ?Use air conditioning when possible.  Many air conditioners have filters that trap the pollen spores. ?Use a HEPA room air filter to remove pollen form the indoor air you breathe. ? ?DUST MITE AVOIDANCE MEASURES: ? ?There are three main measures that need and can be taken to avoid house dust mites: ? ?Reduce accumulation of dust in general ?-reduce furniture, clothing, carpeting, books, stuffed animals, especially in bedroom ? ?Separate yourself from the dust ?-use pillow and mattress encasements (can be found at stores such as [employer] or online) ?-avoid direct exposure to air condition flow ?-use a HEPA filter device, especially in the bedroom; you can also use a HEPA filter vacuum cleaner ?-wipe dust with a moist towel instead of a dry towel or broom when cleaning ? ?Decrease mites and/or their secretions ?-wash clothing and  linen and stuffed animals at highest temperature possible, at least every 2 weeks ?-stuffed animals can also be placed in a bag and put in a freezer overnight ? ?Despite the above measures, it is impossible to eliminate dust mites or their allergen completely from your home.  With the above  measures the burden of mites in your home can be diminished, with the goal of minimizing your allergic symptoms.  Success will be reached only when implementing and using all means together. ? ?Control of Dog or Cat Allergen ? ?Avoidance is the best way to manage a dog or cat allergy. If you have a dog or cat and are allergic to dog or cats, consider removing the dog or cat from the home. ?If you have a dog or cat but don?t want to find it a new home, or if your family wants a pet even though someone in the household is allergic, here are some strategies that may help keep symptoms at bay: ? ?Keep the pet out of your bedroom and restrict it to only a few rooms. Be advised that keeping the dog or cat in only one room will not limit the allergens to that room. ?Don?t pet, hug or kiss the dog or cat; if you do, wash your hands with soap and water. ?High-efficiency particulate air (HEPA) cleaners run continuously in a bedroom or living room can reduce allergen levels over time. ?Regular use of a high-efficiency vacuum cleaner or a central vacuum can reduce allergen levels. ?Giving your dog or cat a bath at least once a week can reduce airborne allergen. ? ?Control of Cockroach Allergen ? ?Cockroach allergen has been identified as an important cause of acute attacks of asthma, especially in urban settings.  There are fifty-five species of cockroach that exist in the [place], however only three, the [place], [place] species produce allergen that can affect patients with Asthma.  Allergens can be obtained from fecal particles, egg casings and secretions from cockroaches. ?   ?Remove food sources. ?Reduce access to water. ?Seal access and entry points. ?Spray runways with 0.5-1% Diazinon or Chlorpyrifos ?Blow boric acid power under stoves and refrigerator. ?Place bait stations (hydramethylnon) at feeding sites. ? ? ?

## 2022-04-13 ENCOUNTER — Encounter: Payer: Self-pay | Admitting: Family

## 2022-05-18 ENCOUNTER — Ambulatory Visit
Admission: RE | Admit: 2022-05-18 | Discharge: 2022-05-18 | Disposition: A | Discharge status: Home / Self Care | Payer: BC Managed Care – PPO | Source: Ambulatory Visit | Attending: Emergency Medicine | Admitting: Emergency Medicine

## 2022-05-18 VITALS — BP 136/89 | HR 80 | Temp 97.7°F | Resp 18

## 2022-05-18 DIAGNOSIS — L0100 Impetigo, unspecified: Secondary | ICD-10-CM

## 2022-05-18 DIAGNOSIS — B354 Tinea corporis: Secondary | ICD-10-CM

## 2022-05-18 DIAGNOSIS — L255 Unspecified contact dermatitis due to plants, except food: Secondary | ICD-10-CM | POA: Diagnosis not present

## 2022-05-18 MED ORDER — KETOCONAZOLE 2 % EX CREA: TOPICAL_CREAM | CUTANEOUS | 1 refills | Status: DC

## 2022-05-18 MED ORDER — TRIAMCINOLONE ACETONIDE 40 MG/ML IJ SUSP
60.0000 mg | Freq: Once | INTRAMUSCULAR | Status: AC
Start: 2022-05-18 — End: 2022-05-18
  Administered 2022-05-18: 60 mg via INTRAMUSCULAR

## 2022-05-18 MED ORDER — PREDNISONE 10 MG PO TABS: ORAL_TABLET | ORAL | 0 refills | Status: AC

## 2022-05-18 MED ORDER — MUPIROCIN 2 % EX OINT: TOPICAL_OINTMENT | CUTANEOUS | 0 refills | Status: DC

## 2022-05-18 NOTE — ED Triage Notes (Signed)
Pt c/o insect bites to his legs and his back (about a week ago).  Home interventions: benadryl and hydrocortisone

## 2022-05-18 NOTE — ED Provider Notes (Signed)
[facility]    CSN: [id number] Arrival date & time: 05/18/22  1459    HISTORY   Chief Complaint  Patient presents with   Insect Bite    I have a number of what were insect bites but they do not appear to be healing. - Entered by patient   HPI Larry Brooks is a 46 y.o. male. Patient presents urgent care today complaining of insect bites on the backs of his legs and on his back that appeared about a week ago and continue to appear in various other places on his body such as his abdomen and his forearms.  Patient states that 5 days ago, he was working in his yard to clear out weeds and brush.  Patient states he is not aware of coming into contact with any known skin irritants such as sumac, ivy or oak but states he also was not paying close attention.  Patient states he has tried Benadryl and hydrocortisone with little relief of symptoms.  Patient states that the lesion on his left knee has been getting progressively larger over the past few days and now has honey colored fluid leaking from it and looks more like an open sore than the rash.  The history is provided by the patient.   Past Medical History:  Diagnosis Date   Childhood asthma    Generalized anxiety disorder    Leukopenia    Thrombocytopenia ([place])    Patient Active Problem List   Diagnosis Date Noted   Seasonal and perennial allergic rhinitis 03/27/2022   Adverse food reaction 03/27/2022   Mild persistent asthma 03/27/2022   Recurrent sinusitis 03/27/2022   Allergies 11/09/2021   Preventative health care 07/15/2021   Cardiac risk counseling 12/16/2011   Bronchitis 09/24/2011   ALLERGIC RHINITIS 10/31/2010   GENERALIZED ANXIETY DISORDER 12/08/2008   HYPERLIPIDEMIA 12/19/2007   Past Surgical History:  Procedure Laterality Date   TONSILLECTOMY     WISDOM TOOTH EXTRACTION      Home Medications    Prior to Admission medications   Medication Sig Start Date End Date Taking? Authorizing Provider   ketoconazole (NIZORAL) 2 % cream Apply to affected areas in the 1/2 inch area surrounding the affected area once daily.  Once rash is completely resolved, continue applying to previously affected area for another 7 days. 05/18/22  Yes Lynden Oxford Scales, PA-C  mupirocin ointment (BACTROBAN) 2 % Apply to affected area twice daily for 10 days. 05/18/22  Yes Lynden Oxford Scales, PA-C  predniSONE (DELTASONE) 10 MG tablet Take 4 tablets (40 mg total) by mouth daily for 5 days, THEN 2 tablets (20 mg total) daily for 5 days, THEN 1 tablet (10 mg total) daily for 5 days. 05/18/22 06/02/22 Yes Lynden Oxford Scales, PA-C  albuterol (VENTOLIN HFA) 108 (90 Base) MCG/ACT inhaler Inhale 2 puffs into the lungs every 6 (six) hours as needed for wheezing or shortness of breath. 03/27/22   Clemon Chambers, MD  cetirizine (ZYRTEC) 10 MG tablet Take 10 mg by mouth daily.    [provider]  Fluticasone Propionate,sensor, (ARMONAIR DIGIHALER) 113 MCG/ACT AEPB Inhale 1 puff into the lungs in the morning and at bedtime. 03/27/22   Clemon Chambers, MD  ipratropium (ATROVENT) 0.03 % nasal spray Place 2 sprays into both nostrils 3 (three) times daily as needed for rhinitis. 12/22/21   Clemon Chambers, MD  Probiotic Product (PROBIOTIC ADVANCED PO) Take 3 capsules by mouth daily.    [provider]  Family History Family History  Problem Relation Age of Onset   Coronary artery disease Father        status post 4 stents    Cancer Father        prostate   Hyperlipidemia Father    COPD Maternal Grandmother    Heart attack Paternal Grandfather    Hyperlipidemia Paternal Grandfather    Diabetes type II Other        grandfather   Hyperlipidemia Other        grandfather   Allergic rhinitis Son    Food Allergy Son        dairy, peanut   Food Allergy Son        peanut   Allergic rhinitis Son    Colon cancer Neg Hx    Asthma Neg Hx    Immunodeficiency Neg Hx    Angioedema Neg Hx    Atopy Neg Hx     Eczema Neg Hx    Urticaria Neg Hx    Social History Social History   Tobacco Use   Smoking status: Never    Passive exposure: Never   Smokeless tobacco: Never   Tobacco comments:    never used tobacco  Vaping Use   Vaping Use: Never used  Substance Use Topics   Alcohol use: Yes    Comment: very rare-social   Drug use: No   Allergies   Patient has no known allergies.  Review of Systems Review of Systems Pertinent findings noted in history of present illness.   Physical Exam Triage Vital Signs ED Triage Vitals  Enc Vitals Group     BP 09/23/21 0827 (!) 147/82     Pulse Rate 09/23/21 0827 72     Resp 09/23/21 0827 18     Temp 09/23/21 0827 98.3 F (36.8 C)     Temp Source 09/23/21 0827 Oral     SpO2 09/23/21 0827 98 %     Weight --      Height --      Head Circumference --      Peak Flow --      Pain Score 09/23/21 0826 5     Pain Loc --      Pain Edu? --      Excl. in [place]? --   No data found.  Updated Vital Signs BP 136/89 (BP Location: Right Arm)   Pulse 80   Temp 97.7 F (36.5 C) (Oral)   Resp 18   SpO2 98%   Physical Exam Vitals and nursing note reviewed.  Constitutional:      General: He is not in acute distress.    Appearance: Normal appearance. He is normal weight. He is not ill-appearing.  HENT:     Head: Normocephalic and atraumatic.  Eyes:     Extraocular Movements: Extraocular movements intact.     Conjunctiva/sclera: Conjunctivae normal.     Pupils: Pupils are equal, round, and reactive to light.  Cardiovascular:     Rate and Rhythm: Normal rate and regular rhythm.  Pulmonary:     Effort: Pulmonary effort is normal.     Breath sounds: Normal breath sounds.  Musculoskeletal:        General: Normal range of motion.     Cervical back: Normal range of motion and neck supple.  Skin:    General: Skin is warm and dry.     Comments: Please see photos below:  Neurological:     General: No focal deficit present.  Mental Status: He is  alert and oriented to person, place, and time. Mental status is at baseline.  Psychiatric:        Mood and Affect: Mood normal.        Behavior: Behavior normal.        Thought Content: Thought content normal.        Judgment: Judgment normal.    1.  Back    2.  Left knee   3.  Left forearm   4.  Right forearm   Visual Acuity Right Eye Distance:   Left Eye Distance:   Bilateral Distance:    Right Eye Near:   Left Eye Near:    Bilateral Near:     UC Couse / Diagnostics / Procedures:    EKG  Radiology No results found.  Procedures Procedures (including critical care time)  UC Diagnoses / Final Clinical Impressions(s)   I have reviewed the triage vital signs and the nursing notes.  Pertinent labs & imaging results that were available during my care of the patient were reviewed by me and considered in my medical decision making (see chart for details).    Final diagnoses:  Dermatitis due to plants, including poison ivy, sumac, and oak  Impetigo  Dermatophytosis of body   Patient advised that whether he has been exposed to dermatophyte, contact irritant or bacteria, his skin is very reactive at this time that I recommend steroid for treatment.  Patient was provided with 60 mg of Kenalog during his visit and a tapering dose of prednisone that he has been instructed to take at the highest dose until he sees improvement of the redness then begin tapering prednisone slowly to off.  Patient was also provided with ketoconazole to treat the lesion on his back which is concerning for dermatophytosis and mupirocin to treat the lesion on his left knee which is concerning for impetigo.  Patient will states he plans to reach out to his allergist first thing tomorrow morning to schedule follow-up appointment for further evaluation.  Patient was advised that if he is unable to get an appointment within a reasonable amount of time, he can certainly come back here for repeat  evaluation.  ED Prescriptions     Medication Sig Dispense Auth. Provider   ketoconazole (NIZORAL) 2 % cream Apply to affected areas in the 1/2 inch area surrounding the affected area once daily.  Once rash is completely resolved, continue applying to previously affected area for another 7 days. 60 g Lynden Oxford Scales, PA-C   mupirocin ointment (BACTROBAN) 2 % Apply to affected area twice daily for 10 days. 30 g Lynden Oxford Scales, PA-C   predniSONE (DELTASONE) 10 MG tablet Take 4 tablets (40 mg total) by mouth daily for 5 days, THEN 2 tablets (20 mg total) daily for 5 days, THEN 1 tablet (10 mg total) daily for 5 days. 35 tablet Lynden Oxford Scales, PA-C      PDMP not reviewed this encounter.  Pending results:  Labs Reviewed - No data to display  Medications Ordered in UC: Medications  triamcinolone acetonide (KENALOG-40) injection 60 mg (60 mg Intramuscular Given 05/18/22 1545)    Disposition Upon Discharge:  Condition: stable for discharge home Home: take medications as prescribed; routine discharge instructions as discussed; follow up as advised.  Patient presented with an acute illness with associated systemic symptoms and significant discomfort requiring urgent management. In my opinion, this is a condition that a prudent lay person (someone who possesses an  average knowledge of health and medicine) may potentially expect to result in complications if not addressed urgently such as respiratory distress, impairment of bodily function or dysfunction of bodily organs.   Routine symptom specific, illness specific and/or disease specific instructions were discussed with the patient and/or caregiver at length.   As such, the patient has been evaluated and assessed, work-up was performed and treatment was provided in alignment with urgent care protocols and evidence based medicine.  Patient/parent/caregiver has been advised that the patient may require follow up for further  testing and treatment if the symptoms continue in spite of treatment, as clinically indicated and appropriate.  Patient/parent/caregiver has been advised to return to the [facility] or PCP if no better; to PCP or the Emergency Department if new signs and symptoms develop, or if the current signs or symptoms continue to change or worsen for further workup, evaluation and treatment as clinically indicated and appropriate  The patient will follow up with their current PCP if and as advised. If the patient does not currently have a PCP we will assist them in obtaining one.   The patient may need specialty follow up if the symptoms continue, in spite of conservative treatment and management, for further workup, evaluation, consultation and treatment as clinically indicated and appropriate.   Patient/parent/caregiver verbalized understanding and agreement of plan as discussed.  All questions were addressed during visit.  Please see discharge instructions below for further details of plan.  Discharge Instructions:   Discharge Instructions      To rapidly treat the inflammation of your skin whether due to exposure to a poisonous plant, strep or fungus, you received an injection of triamcinolone during your visit today.  This should provide you with a nice boost of steroid which will last until you begin taking your first dose of prednisone tomorrow morning.  Your prescription for prednisone states that you should take 4 tablets by mouth for 5 full days then begin tapering to off.  As we discussed, if your immediate symptoms resolve after fewer than 5 days of 4 tablets of prednisone, you can begin tapering off sooner.  Mupirocin has been prescribed for the impetigo, superficial streptococcal, infection of your left knee which is likely the result of acute inflammation and skin breakdown and the opportunistic nature of strep.  Please apply mupirocin to your left knee twice daily until the honey colored drainage  ceases.  Ketoconazole cream has been prescribed for the presumed fungal infection on the left center of your back.  Please apply a generous amount of this cream daily being sure to spread the cream at least 1/2 inch outside of the affected area every time.  Continue using ketoconazole cream daily for a full 7 days after the rash is completely resolved.  Please be sure you follow-up with your allergist as soon as possible for reevaluation and adjustment to this treatment plan as needed based on how you respond the next few days.  If you are unable to get an appointment within a reasonable amount of time, please feel free to return to urgent care for reevaluation if needed.  Thank you for visiting urgent care today.    This office note has been dictated using Museum/gallery curator.  Unfortunately, and despite my best efforts, this method of dictation can sometimes lead to occasional typographical or grammatical errors.  I apologize in advance if this occurs.     Lynden Oxford Scales, PA-C 05/18/22 1610

## 2022-05-18 NOTE — Discharge Instructions (Addendum)
To rapidly treat the inflammation of your skin whether due to exposure to a poisonous plant, strep or fungus, you received an injection of triamcinolone during your visit today.  This should provide you with a nice boost of steroid which will last until you begin taking your first dose of prednisone tomorrow morning.  Your prescription for prednisone states that you should take 4 tablets by mouth for 5 full days then begin tapering to off.  As we discussed, if your immediate symptoms resolve after fewer than 5 days of 4 tablets of prednisone, you can begin tapering off sooner.  Mupirocin has been prescribed for the impetigo, superficial streptococcal, infection of your left knee which is likely the result of acute inflammation and skin breakdown and the opportunistic nature of strep.  Please apply mupirocin to your left knee twice daily until the honey colored drainage ceases.  Ketoconazole cream has been prescribed for the presumed fungal infection on the left center of your back.  Please apply a generous amount of this cream daily being sure to spread the cream at least 1/2 inch outside of the affected area every time.  Continue using ketoconazole cream daily for a full 7 days after the rash is completely resolved.  Please be sure you follow-up with your allergist as soon as possible for reevaluation and adjustment to this treatment plan as needed based on how you respond the next few days.  If you are unable to get an appointment within a reasonable amount of time, please feel free to return to urgent care for reevaluation if needed.  Thank you for visiting urgent care today.

## 2022-05-22 ENCOUNTER — Encounter: Payer: Self-pay | Admitting: Internal Medicine

## 2022-05-22 ENCOUNTER — Ambulatory Visit (INDEPENDENT_AMBULATORY_CARE_PROVIDER_SITE_OTHER): Payer: BC Managed Care – PPO | Admitting: Internal Medicine

## 2022-05-22 VITALS — BP 126/78 | HR 84 | Resp 18

## 2022-05-22 DIAGNOSIS — L03818 Cellulitis of other sites: Secondary | ICD-10-CM

## 2022-05-22 DIAGNOSIS — L237 Allergic contact dermatitis due to plants, except food: Secondary | ICD-10-CM | POA: Diagnosis not present

## 2022-05-22 MED ORDER — CLOBETASOL PROPIONATE 0.05 % EX OINT: TOPICAL_OINTMENT | CUTANEOUS | 1 refills | Status: DC

## 2022-05-22 MED ORDER — CEPHALEXIN 500 MG PO CAPS: 500.0000 mg | ORAL_CAPSULE | Freq: Four times a day (QID) | ORAL | 0 refills | Status: AC

## 2022-05-22 NOTE — Progress Notes (Signed)
Follow Up Note  RE: Larry Brooks MRN: [id number] DOB: [birth year] Date of Office Visit: 05/22/2022  Referring provider: Sandford Craze, NP Primary care provider: Sandford Craze, NP  Chief Complaint: Rash (X 1 week)  History of Present Illness: I had the pleasure of seeing Larry Brooks for a follow up visit at the [facility] on 05/22/2022. He is a 46 y.o. male, who is being followed for Allergic rhinitis, recurrent sinusitis, mild persistent asthma, concern for food allergy. His previous allergy office visit was on 03/27/22 with Dr. Maurine Minister. Today is a  acute visit for ED follow up  .  History obtained from patient .  He reported doing yard work prior to a trip to [name] with significant outdoor exposure.  Approximately 3 days afterwards he developed pruritic dermatitis on bilateral legs, arms and back.  He presented to the urgent care on 05/18/2022 where he was diagnosed with contact dermatitis and started on 10-day of prednisone.  He also started mupirocin for current concern for secondary infection.  He reports since starting prednisone spread of rash has halted but still persist.  He is taking Zyrtec 10 mg daily however still has significant pruritus.  There is one annular lesion on his back that was concerning for tinea corporis and he was started on ketoconazole cream by urgent care.  He has not started any topical steroids.  Otherwise he is in his usual state of health and has no wounds with underlying rhinitis, sinusitis, asthma or food allergies.  Pertinent History/Diagnostics:  - Asthma: mild persistent, active during respiratory illness                - pre/post spirometry (12/22/21) while asymptomatic: ratio 100%, 75% FEV1 (pre), 75% FEV1 (post)-nonobstructive, no improvement with bronchodilator, scooping noted on expiratory loop - Allergic Rhinitis:                 - SPT environmental panel (12/22/21): positive to grasses, weeds, trees,  indoor/outdoor molds, dust mites, cat, dogs, mixed feathers, cockroach, mouse                - interest in AIT-potentially if symptoms worsen, but not at this time - Food Intolerance (almonds)                - Hx of reaction: -gets mouth sores if eats too many, so avoids for the most part                - SPT select foods (almond): 2 x 2 (same as negative control)                - labs: igE almond < 0.10  - Recurrent sinusitis: Getting antibiotics 3-4 times per year-usually for sinus infection that goes to bronchitis, pneumonia twice - hospitalized in High School, No history of skin infection.  No recurrent ear infections.  No hospitalizations outside of high school.   Increase infections in number the past 2-3 years.                  - immune evaluation labs: CH50, IgM, IgA, IgG, CBCd, diphteria, tetanus and strep titers all within normal limits                - no previous sinus imaging, last visit with ENT when he was a small child  Assessment and Plan: [name] is a 46 y.o. male with: Allergic contact dermatitis due to plants, except food  Cellulitis of  other specified site Plan: Patient Instructions  Dermatitis  -You have an underlying contact dermatitis and now secondary skin infection -Continue prednisone as prescribed for full course, take it morning to reduce interference with sleep -Start clobetasol ointment twice a day on all lesions, continue for 2 weeks  -Do not use on your face -Start Keflex 500mg  4 times a day for 7 days  -Continue ketoconazole (antifungal) on lesion on back -Continue Zyrtec 10 mg daily, add Benadryl 25 mg at night for itch  Follow up: in 2 weeks if not better   Thank you so much for letting me partake in your care today.  Don't hesitate to reach out if you have any additional concerns!  Ferol Luz, MD  [facility] [place] [place]  No follow-ups on file.  No orders of the defined types were placed in this encounter.   Lab Orders   No laboratory test(s) ordered today   Diagnostics: None performed    Medication List:  Current Outpatient Medications  Medication Sig Dispense Refill   albuterol (VENTOLIN HFA) 108 (90 Base) MCG/ACT inhaler Inhale 2 puffs into the lungs every 6 (six) hours as needed for wheezing or shortness of breath. 18 g 1   cetirizine (ZYRTEC) 10 MG tablet Take 10 mg by mouth daily.     Fluticasone Propionate,sensor, (ARMONAIR DIGIHALER) 113 MCG/ACT AEPB Inhale 1 puff into the lungs in the morning and at bedtime. 1 each 3   ipratropium (ATROVENT) 0.03 % nasal spray Place 2 sprays into both nostrils 3 (three) times daily as needed for rhinitis. 30 mL 5   ketoconazole (NIZORAL) 2 % cream Apply to affected areas in the 1/2 inch area surrounding the affected area once daily.  Once rash is completely resolved, continue applying to previously affected area for another 7 days. 60 g 1   mupirocin ointment (BACTROBAN) 2 % Apply to affected area twice daily for 10 days. 30 g 0   predniSONE (DELTASONE) 10 MG tablet Take 4 tablets (40 mg total) by mouth daily for 5 days, THEN 2 tablets (20 mg total) daily for 5 days, THEN 1 tablet (10 mg total) daily for 5 days. 35 tablet 0   Probiotic Product (PROBIOTIC ADVANCED PO) Take 3 capsules by mouth daily.     No current facility-administered medications for this visit.   Allergies: No Known Allergies I reviewed his past medical history, social history, family history, and environmental history and no significant changes have been reported from his previous visit.  ROS: All others negative except as noted per HPI.   Objective: BP 126/78   Pulse 84   Resp 18   SpO2 98%  There is no height or weight on file to calculate BMI. General Appearance:  Alert, cooperative, no distress, appears stated age  Head:  Normocephalic, without obvious abnormality, atraumatic  Eyes:  Conjunctiva clear, EOM's intact  Nose: Nares normal,  Throat: Lips, tongue normal; teeth and gums  normal,   Neck: Supple, symmetrical  Lungs:   , Respirations unlabored, no coughing  Heart:  regular rate and rhythm, Appears well perfused  Extremities: No edema  Skin: Raised erythematous patches with excoriation marks, scabbing on bilateral knees, arms, back, some with linear marks.  Some with honey colored crusting.  No fluctuance with palpitation, warm to touch.  Neurologic: No gross deficits   Previous notes and tests were reviewed. The plan was reviewed with the patient/family, and all questions/concerned were addressed.  It was my pleasure to  see Durwin today and participate in his care. Please feel free to contact me with any questions or concerns.  Sincerely,  Ferol Luz, MD  Allergy & Immunology  [facility] Office: [phone]

## 2022-05-24 ENCOUNTER — Encounter: Payer: Self-pay | Admitting: Internal Medicine

## 2022-06-29 ENCOUNTER — Ambulatory Visit: Payer: BC Managed Care – PPO | Admitting: Internal Medicine

## 2022-07-04 NOTE — Progress Notes (Unsigned)
FOLLOW UP Date of Service/Encounter:  07/06/22   Subjective:  Larry Brooks (DOB: 1976-10-01) is a 46 y.o. male who [facility] in re-evaluation of the following: mild persistent asthma, allergic rhinitis with recurrent sinusitis History obtained from: chart review and patient.  For Review, LV was on 05/22/22  with Dr. Marlynn Perking seen for acute visit for ED follow-up for allergic contact dermatitis due to plants with cellulitis . He was continued on prednisone, prescribed clobetasol and keflex.  Today presents for follow-up. Asthma: has not required armonair or albuterol since last visit. No systemic steroids.  He anticipates needing it going into Fall potentially.  Has [employer] refill at house. Allergic rhinitis: Doing well with zyrtec and flonase as needed, no sinus infections since last visit. Rash-now resolved.  He is eating almonds in small quantities and tolerating without symptoms.   ---------------------------------------------- Pertinent History/Diagnostics:  - Asthma: mild persistent, active during respiratory illness                - pre/post spirometry (12/22/21) while asymptomatic: ratio 100%, 75% FEV1 (pre), 75% FEV1 (post)-nonobstructive, no improvement with bronchodilator, scooping noted on expiratory loop - Allergic Rhinitis:                 - SPT environmental panel (12/22/21): positive to grasses, weeds, trees, indoor/outdoor molds, dust mites, cat, dogs, mixed feathers, cockroach, mouse                - interest in AIT-potentially if symptoms worsen, but not at this time - Food Intolerance (almonds)                - Hx of reaction: -gets mouth sores if eats too many, so avoids large quantities                - SPT select foods (almond): 2 x 2 (same as negative control)                - labs: igE almond < 0.10  - Recurrent sinusitis: Getting antibiotics 3-4 times per year-usually for sinus infection that goes to bronchitis,  pneumonia twice - hospitalized in High School, No history of skin infection.  No recurrent ear infections.  No hospitalizations outside of high school.   Increased number of infections in the past 2-3 years (2020-2023)                  - immune evaluation labs: CH50, IgM, IgA, IgG, CBCd, diphteria, tetanus and strep titers all within normal limits                - no previous sinus imaging, last visit with ENT when he was a small child  Allergies as of 07/06/2022   Not on File      Medication List        Accurate as of July 06, 2022  1:02 PM. If you have any questions, ask your nurse or doctor.          albuterol 108 (90 Base) MCG/ACT inhaler Commonly known as: VENTOLIN HFA Inhale 2 puffs into the lungs every 6 (six) hours as needed for wheezing or shortness of breath.   ArmonAir Digihaler 113 MCG/ACT Aepb Generic drug: Fluticasone Propionate(sensor) Inhale 1 puff into the lungs in the morning and at bedtime.   cetirizine 10 MG tablet Commonly known as: ZYRTEC Take 10 mg by mouth daily.   clobetasol ointment 0.05 % Commonly known as:  TEMOVATE Apply topically twice daily to BODY as needed for SEVERE red, sandpaper and thickened like rash.  Do not use on face, groin or armpits.  Use for up to two weeks at a time.   ipratropium 0.03 % nasal spray Commonly known as: ATROVENT Place 2 sprays into both nostrils 3 (three) times daily as needed for rhinitis.   ketoconazole 2 % cream Commonly known as: NIZORAL Apply to affected areas in the 1/2 inch area surrounding the affected area once daily.  Once rash is completely resolved, continue applying to previously affected area for another 7 days.   mupirocin ointment 2 % Commonly known as: BACTROBAN Apply to affected area twice daily for 10 days.   PROBIOTIC ADVANCED PO Take 3 capsules by mouth daily.       Past Medical History:  Diagnosis Date   Childhood asthma    Generalized anxiety disorder    Leukopenia     Thrombocytopenia (HCC)    Past Surgical History:  Procedure Laterality Date   TONSILLECTOMY     WISDOM TOOTH EXTRACTION     Otherwise, there have been no changes to his past medical history, surgical history, family history, or social history.  ROS: All others negative except as noted per HPI.   Objective:  BP 122/76 (BP Location: Left Leg, Patient Position: Sitting, Cuff Size: Normal)   Pulse 89   Temp 98.2 F (36.8 C) (Temporal)   Resp 20   Wt 192 lb (87.1 kg)   SpO2 97%   BMI 27.18 kg/m  Body mass index is 27.18 kg/m. Physical Exam: General Appearance:  Alert, cooperative, no distress, appears stated age  Head:  Normocephalic, without obvious abnormality, atraumatic  Eyes:  Conjunctiva clear, EOM's intact  Nose: Nares normal, hypertrophic turbinates, normal mucosa, no visible anterior polyps, and septum midline  Throat: Lips, tongue normal; teeth and gums normal, normal posterior oropharynx  Neck: Supple, symmetrical  Lungs:   clear to auscultation bilaterally, Respirations unlabored, no coughing  Heart:  regular rate and rhythm and no murmur, Appears well perfused  Extremities: No edema  Skin: Skin color, texture, turgor normal, no rashes or lesions on visualized portions of skin  Neurologic: No gross deficits   Assessment/Plan   Seasonal and perennial allergic rhinitis: controlled - allergen avoidance towards grasses, weeds, trees, indoor/outdoor molds, dust mites, cat, dogs, mixed feathers, cockroach, mouse - consider allergy shots as long term control of your symptoms by teaching your immune system to be more tolerant of your allergy triggers - Continue Flonase 1-2 sprays daily as needed. - Continue over the counter antihistamine daily or daily as needed.   -Your options include Zyrtec (Cetirizine) 10mg , Claritin (Loratadine) 10mg , Allegra (Fexofenadine) 180mg , or Xyzal (Levocetirinze) 5mg   Recurrent sinusitis: stable - immune labs were reassuring, no further  immune evaluation indicated - continue to keep track of sinus infections and how often you are needing antibiotics, may consider sinus imaging in the future if this continues to be an issue  Mild Persistent Asthma: controlled - Controller Inhaler: To be used at start of respiratory illness:  Start Armonair 113, 1 puff twice a day; Use In Block Therapy-Start if having respiratory symptoms.  Use for at least 1 week or until symptoms resolve. - Rinse mouth out after use - Rescue Inhaler: Albuterol (Proair/Ventolin) 2 puffs . Use  every 4-6 hours as needed for chest tightness, wheezing, or coughing.  Can also use 15 minutes prior to exercise if you have symptoms with activity. - Asthma  is not controlled if:  - Symptoms are occurring >2 times a week OR  - >2 times a month nighttime awakenings  - You are requiring systemic steroids (prednisone/steroid injections) more than once per year  - Your require hospitalization for your asthma.  - Please call the clinic to schedule a follow up if these symptoms arise  Almond Intolerance-stable - avoidance is optional based on tolerance of symptoms  Rash-suspected contact dermatitis, unknown etiology; resolved  Follow-up in 6 months, sooner if needed. It was a pleasure seeing you again in clinic today!  Tonny Bollman, MD  [facility]

## 2022-07-06 ENCOUNTER — Encounter: Payer: Self-pay | Admitting: Internal Medicine

## 2022-07-06 ENCOUNTER — Ambulatory Visit (INDEPENDENT_AMBULATORY_CARE_PROVIDER_SITE_OTHER): Payer: BC Managed Care – PPO | Admitting: Internal Medicine

## 2022-07-06 VITALS — BP 122/76 | HR 89 | Temp 98.2°F | Resp 20 | Wt 192.0 lb

## 2022-07-06 DIAGNOSIS — J3089 Other allergic rhinitis: Secondary | ICD-10-CM

## 2022-07-06 DIAGNOSIS — K9049 Malabsorption due to intolerance, not elsewhere classified: Secondary | ICD-10-CM

## 2022-07-06 DIAGNOSIS — L237 Allergic contact dermatitis due to plants, except food: Secondary | ICD-10-CM

## 2022-07-06 DIAGNOSIS — J302 Other seasonal allergic rhinitis: Secondary | ICD-10-CM

## 2022-07-06 DIAGNOSIS — J4530 Mild persistent asthma, uncomplicated: Secondary | ICD-10-CM

## 2022-07-06 NOTE — Patient Instructions (Addendum)
Seasonal and perennial allergic rhinitis: - allergen avoidance towards grasses, weeds, trees, indoor/outdoor molds, dust mites, cat, dogs, mixed feathers, cockroach, mouse - consider allergy shots as long term control of your symptoms by teaching your immune system to be more tolerant of your allergy triggers - Continue Flonase 1-2 sprays daily as needed. - Continue over the counter antihistamine daily or daily as needed.   -Your options include Zyrtec (Cetirizine) 10mg , Claritin (Loratadine) 10mg , Allegra (Fexofenadine) 180mg , or Xyzal (Levocetirinze) 5mg   Recurrent sinusitis:  - immune labs were reassuring, no further immune evaluation indicated - continue to keep track of sinus infections and how often you are needing antibiotics, may consider sinus imaging in the future if this continues to be an issue  Mild Persistent Asthma: - Controller Inhaler: To be used at start of respiratory illness:  Start Armonair 113, 1 puff twice a day; Use In Block Therapy-Start if having respiratory symptoms.  Use for at least 1 week or until symptoms resolve. - Rinse mouth out after use - Rescue Inhaler: Albuterol (Proair/Ventolin) 2 puffs . Use  every 4-6 hours as needed for chest tightness, wheezing, or coughing.  Can also use 15 minutes prior to exercise if you have symptoms with activity. - Asthma is not controlled if:  - Symptoms are occurring >2 times a week OR  - >2 times a month nighttime awakenings  - You are requiring systemic steroids (prednisone/steroid injections) more than once per year  - Your require hospitalization for your asthma.  - Please call the clinic to schedule a follow up if these symptoms arise  Almond Intolerance - avoidance is optional based on tolerance of symptoms  Rash-suspected contact dermatitis, unknown etiology; resolved  Follow-up in 6 months, sooner if needed. It was a pleasure seeing you again in clinic today!  , MD [facility]  [place]  Reducing Pollen Exposure  The American Academy of Allergy, Asthma and Immunology suggests the following steps to reduce your exposure to pollen during allergy seasons.    Do not hang sheets or clothing out to dry; pollen may collect on these items. Do not mow lawns or spend time around freshly cut grass; mowing stirs up pollen. Keep windows closed at night.  Keep car windows closed while driving. Minimize morning activities outdoors, a time when pollen counts are usually at their highest. Stay indoors as much as possible when pollen counts or humidity is high and on windy days when pollen tends to remain in the air longer. Use air conditioning when possible.  Many air conditioners have filters that trap the pollen spores. Use a HEPA room air filter to remove pollen form the indoor air you breathe.  DUST MITE AVOIDANCE MEASURES:  There are three main measures that need and can be taken to avoid house dust mites:  Reduce accumulation of dust in general -reduce furniture, clothing, carpeting, books, stuffed animals, especially in bedroom  Separate yourself from the dust -use pillow and mattress encasements (can be found at stores such as [employer] or online) -avoid direct exposure to air condition flow -use a HEPA filter device, especially in the bedroom; you can also use a HEPA filter vacuum cleaner -wipe dust with a moist towel instead of a dry towel or broom when cleaning  Decrease mites and/or their secretions -wash clothing and linen and stuffed animals at highest temperature possible, at least every 2 weeks -stuffed animals can also be placed in a bag and put in a  freezer overnight  Despite the above measures, it is impossible to eliminate dust mites or their allergen completely from your home.  With the above measures the burden of mites in your home can be diminished, with the goal of minimizing your allergic symptoms.  Success will be reached only when  implementing and using all means together.  Control of Dog or Cat Allergen  Avoidance is the best way to manage a dog or cat allergy. If you have a dog or cat and are allergic to dog or cats, consider removing the dog or cat from the home. If you have a dog or cat but don't want to find it a new home, or if your family wants a pet even though someone in the household is allergic, here are some strategies that may help keep symptoms at bay:  Keep the pet out of your bedroom and restrict it to only a few rooms. Be advised that keeping the dog or cat in only one room will not limit the allergens to that room. Don't pet, hug or kiss the dog or cat; if you do, wash your hands with soap and water. High-efficiency particulate air (HEPA) cleaners run continuously in a bedroom or living room can reduce allergen levels over time. Regular use of a high-efficiency vacuum cleaner or a central vacuum can reduce allergen levels. Giving your dog or cat a bath at least once a week can reduce airborne allergen.  Control of Cockroach Allergen  Cockroach allergen has been identified as an important cause of acute attacks of asthma, especially in urban settings.  There are fifty-five species of cockroach that exist in the [place], however only three, the [place], [place] species produce allergen that can affect patients with Asthma.  Allergens can be obtained from fecal particles, egg casings and secretions from cockroaches.    Remove food sources. Reduce access to water. Seal access and entry points. Spray runways with 0.5-1% Diazinon or Chlorpyrifos Blow boric acid power under stoves and refrigerator. Place bait stations (hydramethylnon) at feeding sites.

## 2022-07-17 ENCOUNTER — Ambulatory Visit (INDEPENDENT_AMBULATORY_CARE_PROVIDER_SITE_OTHER): Payer: BC Managed Care – PPO | Admitting: Family

## 2022-07-17 ENCOUNTER — Encounter: Payer: Self-pay | Admitting: Family

## 2022-07-17 VITALS — BP 108/76 | HR 71 | Temp 98.0°F | Resp 16 | Ht 71.0 in | Wt 194.0 lb

## 2022-07-17 DIAGNOSIS — Z0000 Encounter for general adult medical examination without abnormal findings: Secondary | ICD-10-CM | POA: Diagnosis not present

## 2022-07-17 DIAGNOSIS — M25511 Pain in right shoulder: Secondary | ICD-10-CM | POA: Diagnosis not present

## 2022-07-17 DIAGNOSIS — M24111 Other articular cartilage disorders, right shoulder: Secondary | ICD-10-CM | POA: Insufficient documentation

## 2022-07-17 NOTE — Assessment & Plan Note (Signed)
Continue healthy diet, exercise, weight loss efforts.  Recommended covid booster/flu shot this fall. Cologuard up to date.

## 2022-07-17 NOTE — Progress Notes (Signed)
ann

## 2022-07-17 NOTE — Assessment & Plan Note (Signed)
New. Refer to sports medicine. ?

## 2022-07-17 NOTE — Progress Notes (Signed)
Subjective:     Patient ID: Larry Brooks, male    DOB: 23-Feb-1976, 46 y.o.   MRN: [id number]  Chief Complaint  Patient presents with   Annual Exam    HPI  Patient presents today for complete physical.  Immunizations: tdap 2023, Pfizer x 2  Diet: diet is good overall  Working on portion control. Was at 200 pounds before summer. Wt Readings from Last 3 Encounters:  07/17/22 194 lb (88 kg)  07/06/22 192 lb (87.1 kg)  03/27/22 189 lb (85.7 kg)  Exercise: walks 4x a week for 30 minutes Colonoscopy: had negative cologuard 7 months ago Vision: has appointment 9/1 Dental: up to date  Reports right shoulder pops a lot.  Has pain with lifting weights.    He is working with an Proofreader.  Reports that his symptoms are improving.   Reports that he had a pimple in the right groin that recently drained. Seems to be improving.    Health Maintenance Due  Topic Date Due   COVID-19 Vaccine (3 - Pfizer series) 06/05/2020    Past Medical History:  Diagnosis Date   Childhood asthma    Generalized anxiety disorder    Leukopenia    Thrombocytopenia (HCC)     Past Surgical History:  Procedure Laterality Date   TONSILLECTOMY     WISDOM TOOTH EXTRACTION      Family History  Problem Relation Age of Onset   Coronary artery disease Father        status post 4 stents    Cancer Father        prostate   Hyperlipidemia Father    COPD Maternal Grandmother    Heart attack Paternal Grandfather    Hyperlipidemia Paternal Grandfather    Diabetes type II Other        grandfather   Hyperlipidemia Other        grandfather   Allergic rhinitis Son    Food Allergy Son        dairy, peanut   Food Allergy Son        peanut   Allergic rhinitis Son    Colon cancer Neg Hx    Asthma Neg Hx    Immunodeficiency Neg Hx    Angioedema Neg Hx    Atopy Neg Hx    Eczema Neg Hx    Urticaria Neg Hx     Social History   Socioeconomic History   Marital status: Married    Spouse name: Not  on file   Number of children: Not on file   Years of education: Not on file   Highest education level: Not on file  Occupational History   Not on file  Tobacco Use   Smoking status: Never    Passive exposure: Never   Smokeless tobacco: Never   Tobacco comments:    never used tobacco  Vaping Use   Vaping Use: Never used  Substance and Sexual Activity   Alcohol use: Yes    Comment: very rare-social   Drug use: No   Sexual activity: Yes    Partners: Female  Other Topics Concern   Not on file  Social History Narrative   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 18 g 1   cetirizine (ZYRTEC) 10 MG tablet Take 10 mg by mouth daily.     Fluticasone Propionate,sensor, (ARMONAIR DIGIHALER) 113 MCG/ACT AEPB Inhale 1 puff into the lungs in the morning and at bedtime. 1 each 3   ipratropium (ATROVENT) 0.03 % nasal spray Place 2 sprays into both nostrils 3 (three) times daily as needed for rhinitis. 30 mL 5   Probiotic Product (PROBIOTIC ADVANCED PO) Take 3 capsules by mouth daily.     clobetasol ointment (TEMOVATE) 0.05 % Apply topically twice daily to BODY as needed for SEVERE red, sandpaper and thickened like rash.  Do not use on face, groin or armpits.  Use for up to two weeks at a time. 60 g 1   ketoconazole (NIZORAL) 2 % cream Apply to affected areas in the 1/2 inch area surrounding the affected area once daily.   Once rash is completely resolved, continue applying to previously affected area for another 7 days. 60 g 1   mupirocin ointment (BACTROBAN) 2 % Apply to affected area twice daily for 10 days. 30 g 0   No facility-administered medications prior to visit.    Not on File  Review of Systems  Constitutional:  Negative for weight loss.  HENT:  Negative for congestion and hearing loss.   Eyes:  Negative for blurred vision.  Respiratory:  Negative for cough.   Cardiovascular:  Negative for leg swelling.  Gastrointestinal:  Negative for constipation and diarrhea.  Genitourinary:  Negative for dysuria and frequency.  Musculoskeletal:  Positive for joint pain (right shoulder).  Skin:  Negative for rash.  Neurological:  Negative for headaches.  Psychiatric/Behavioral:         Denies depression/anxiety       Objective:    Physical Exam  BP 108/76 (BP Location: Left Arm, Patient Position: Sitting, Cuff Size: Large)   Pulse 71   Temp 98 F (36.7 C) (Oral)   Resp 16   Ht 5\' 11"  (1.803 m)   Wt 194 lb (88 kg)   SpO2 100%   BMI 27.06 kg/m  Wt Readings from Last 3 Encounters:  07/17/22 194 lb (88 kg)  07/06/22 192 lb (87.1 kg)  03/27/22 189 lb (85.7 kg)   Physical Exam  Constitutional: He is oriented to person, place, and time. He appears well-developed and well-nourished. No distress.  HENT:  Head: Normocephalic and atraumatic.  Right Ear: Tympanic membrane and ear canal normal.  Left Ear: Tympanic membrane and ear canal normal.  Mouth/Throat: Oropharynx is clear and moist.  Eyes: Pupils are equal, round, and reactive to light. No scleral icterus.  Neck: Normal range of motion. No thyromegaly present.  Cardiovascular: Normal rate and regular rhythm.   No murmur heard. Pulmonary/Chest: Effort normal and breath sounds normal. No respiratory distress. He has no wheezes. He has no rales. He exhibits no tenderness.  Abdominal: Soft. Bowel sounds are normal. He exhibits no distension  and no mass. There is no tenderness. There is no rebound and no guarding.  Musculoskeletal: He exhibits no edema.  Lymphadenopathy:    He has no cervical adenopathy.  Neurological: He is  alert and oriented to person, place, and time. He has normal patellar reflexes. He exhibits normal muscle tone. Coordination normal.  Skin: Skin is warm and dry. (Resolving folliculitis right inguinal area) Psychiatric: He has a normal mood and affect. His behavior is normal. Judgment and thought content normal.           Assessment & Plan:       Assessment & Plan:   Problem List Items Addressed This Visit       Unprioritized   Preventative health care - Primary    Continue healthy diet, exercise, weight loss efforts.  Recommended covid booster/flu shot this fall. Cologuard up to date.       Acute pain of right shoulder    New. Refer to sports medicine.       Relevant Orders   Ambulatory referral to Sports Medicine    I have discontinued Caryn Bee Enderle's ketoconazole, mupirocin ointment, and clobetasol ointment. I am also having him maintain his Probiotic Product (PROBIOTIC ADVANCED PO), cetirizine, ipratropium, ArmonAir Digihaler, and albuterol.  No orders of the defined types were placed in this encounter.

## 2022-07-24 ENCOUNTER — Ambulatory Visit: Payer: Self-pay

## 2022-07-24 ENCOUNTER — Encounter: Payer: Self-pay | Admitting: Family Medicine

## 2022-07-24 ENCOUNTER — Ambulatory Visit (INDEPENDENT_AMBULATORY_CARE_PROVIDER_SITE_OTHER): Payer: BC Managed Care – PPO | Admitting: Family Medicine

## 2022-07-24 VITALS — BP 112/66 | Ht 71.0 in | Wt 194.0 lb

## 2022-07-24 DIAGNOSIS — M24111 Other articular cartilage disorders, right shoulder: Secondary | ICD-10-CM | POA: Diagnosis not present

## 2022-07-24 DIAGNOSIS — M25511 Pain in right shoulder: Secondary | ICD-10-CM

## 2022-07-24 NOTE — Assessment & Plan Note (Signed)
Acute on chronic in nature.  Has changes consistent with irritation of the labrum. -Counseled on home exercise therapy and supportive care. -Could consider injection or physical therapy.

## 2022-07-24 NOTE — Patient Instructions (Signed)
Nice to meet you Please alternate heat and ice  Please try the exercises   Please send me a message in MyChart with any questions or updates.  Please see me back in 4 weeks.   --Dr. Ayako Tapanes  

## 2022-07-24 NOTE — Progress Notes (Signed)
  Larry Brooks - 46 y.o. male MRN [id number]  Date of birth: 02-15-1976  SUBJECTIVE:  Including CC & ROS.  No chief complaint on file.   Larry Brooks is a 46 y.o. male that is presenting with acute on chronic right shoulder pain.  He felt pain several months ago.  He has felt pain with doing an overhead press.  Pain is localized to the shoulder.    Review of Systems See HPI   HISTORY: Past Medical, Surgical, Social, and Family History Reviewed & Updated per EMR.   Pertinent Historical Findings include:  Past Medical History:  Diagnosis Date   Childhood asthma    Generalized anxiety disorder    Leukopenia    Thrombocytopenia (HCC)     Past Surgical History:  Procedure Laterality Date   TONSILLECTOMY     WISDOM TOOTH EXTRACTION       PHYSICAL EXAM:  VS: BP 112/66 (BP Location: Left Arm, Patient Position: Sitting)   Ht 5\' 11"  (1.803 m)   Wt 194 lb (88 kg)   BMI 27.06 kg/m  Physical Exam Gen: NAD, alert, cooperative with exam, well-appearing MSK:  Neurovascularly intact    Limited ultrasound: right shoulder:  Effusion noticed within the bicipital tendon sheath. Normal-appearing subscapularis. Normal-appearing supraspinatus with overlying bursitis. No changes in the posterior glenohumeral joint.  Summary: Effusion of biceps tendon sheath as well as subacromial bursitis  Ultrasound and interpretation by , MD    ASSESSMENT & PLAN:   Labral tear of shoulder, degenerative, right Acute on chronic in nature.  Has changes consistent with irritation of the labrum. -Counseled on home exercise therapy and supportive care. -Could consider injection or physical therapy.

## 2022-07-27 ENCOUNTER — Ambulatory Visit: Payer: BC Managed Care – PPO | Admitting: Family Medicine

## 2022-08-21 ENCOUNTER — Encounter: Payer: Self-pay | Admitting: Family Medicine

## 2022-08-21 ENCOUNTER — Ambulatory Visit (INDEPENDENT_AMBULATORY_CARE_PROVIDER_SITE_OTHER): Payer: BC Managed Care – PPO | Admitting: Family Medicine

## 2022-08-21 VITALS — BP 130/90 | Ht 71.0 in | Wt 194.0 lb

## 2022-08-21 DIAGNOSIS — M24111 Other articular cartilage disorders, right shoulder: Secondary | ICD-10-CM | POA: Diagnosis not present

## 2022-08-21 NOTE — Patient Instructions (Signed)
Good to see you Please continue the exercises  You can consider the compression  Please send me a message in MyChart with any questions or updates.  Please see me back as needed.   --Dr. Raeford Razor

## 2022-08-21 NOTE — Assessment & Plan Note (Signed)
Continues to improve with home exercises.  Slowly regaining his strength. -Counseled on home exercise therapy and supportive care. -Could consider injection or physical therapy.

## 2022-08-21 NOTE — Progress Notes (Signed)
  Larry Brooks - 46 y.o. male MRN [id number]  Date of birth: June 05, 1976  SUBJECTIVE:  Including CC & ROS.  No chief complaint on file.   Larry Brooks is a 46 y.o. male that is following up for his shoulder pain.  He has been doing well with the home exercises.  His shoulder is popping less and has less pain.  Has good range of motion.   Review of Systems See HPI   HISTORY: Past Medical, Surgical, Social, and Family History Reviewed & Updated per EMR.   Pertinent Historical Findings include:  Past Medical History:  Diagnosis Date   Childhood asthma    Generalized anxiety disorder    Leukopenia    Thrombocytopenia (HCC)     Past Surgical History:  Procedure Laterality Date   TONSILLECTOMY     WISDOM TOOTH EXTRACTION       PHYSICAL EXAM:  VS: BP (!) 130/90 (BP Location: Right Arm, Patient Position: Sitting)   Ht 5\' 11"  (1.803 m)   Wt 194 lb (88 kg)   BMI 27.06 kg/m  Physical Exam Gen: NAD, alert, cooperative with exam, well-appearing MSK:  Neurovascularly intact       ASSESSMENT & PLAN:   Labral tear of shoulder, degenerative, right Continues to improve with home exercises.  Slowly regaining his strength. -Counseled on home exercise therapy and supportive care. -Could consider injection or physical therapy.

## 2022-09-29 ENCOUNTER — Encounter: Payer: Self-pay | Admitting: *Deleted

## 2022-09-29 ENCOUNTER — Telehealth: Payer: Self-pay | Admitting: Family

## 2022-09-29 NOTE — Telephone Encounter (Signed)
See mychart.  

## 2023-01-02 ENCOUNTER — Telehealth (INDEPENDENT_AMBULATORY_CARE_PROVIDER_SITE_OTHER): Payer: BC Managed Care – PPO | Admitting: Family

## 2023-01-02 DIAGNOSIS — J069 Acute upper respiratory infection, unspecified: Secondary | ICD-10-CM

## 2023-01-02 MED ORDER — PROMETHAZINE-DM 6.25-15 MG/5ML PO SYRP: 5.0000 mL | ORAL_SOLUTION | Freq: Four times a day (QID) | ORAL | 0 refills | Status: DC | PRN

## 2023-01-02 NOTE — Progress Notes (Signed)
MyChart Video Visit    Virtual Visit via Video Note   This visit type was conducted due to national recommendations for restrictions regarding the COVID-19 Pandemic (e.g. social distancing) in an effort to limit this patient's exposure and mitigate transmission in our community. This patient is at least at moderate risk for complications without adequate follow up. This format is felt to be most appropriate for this patient at this time. Physical exam was limited by quality of the video and audio technology used for the visit. CMA was able to get the patient set up on a video visit.  Patient location: Patient and provider in visit Provider location: Office  I discussed the limitations of evaluation and management by telemedicine and the availability of in person appointments. The patient expressed understanding and agreed to proceed.  Visit Date: 01/02/2023.  Today's healthcare provider: Nance Pear, NP     Subjective:    Patient ID: Larry Brooks, male    DOB: [birth year], 47 y.o.   MRN: [id number]  Chief Complaint  Patient presents with   Nasal Congestion    Patient complains of congestion for the past 3 days   Generalized Body Aches    Complains of body aches with headache   Cough    Complains of productive cough    HPI Patient is in today for a virtual office visit.  New Illness: About 47 hours ago, he developed congestion and had difficulty sleeping. Yesterday he was unable to produce sputum/phlegm and therefore found it difficult to breathe. Mostly he complains of congestion, bothersome cough keeping him awake, and generally feeling horrible. For treatment he has been drinking more water and trying to relax. He has tried using his inhalers but this only helped a little. He does not have a home COVID test. This past Fall he did not receive an influenza vaccination. Typically he works from home, but he does not plan to return to work for several days.  Denies  having any fever, new muscle pain, joint pain , new moles, sinus pain, sore throat, chest pain, palpations, wheezing, n/v/d constipation, blood in stool, dysuria, frequency, hematuria, at this time   Past Medical History:  Diagnosis Date   Childhood asthma    Generalized anxiety disorder    Leukopenia    Thrombocytopenia ([place])     Past Surgical History:  Procedure Laterality Date   TONSILLECTOMY     WISDOM TOOTH EXTRACTION      Family History  Problem Relation Age of Onset   Coronary artery disease Father        status post 4 stents    Cancer Father        prostate   Hyperlipidemia Father    COPD Maternal Grandmother    Heart attack Paternal Grandfather    Hyperlipidemia Paternal Grandfather    Diabetes type II Other        grandfather   Hyperlipidemia Other        grandfather   Allergic rhinitis Son    Food Allergy Son        dairy, peanut   Food Allergy Son        peanut   Allergic rhinitis Son    Colon cancer Neg Hx    Asthma Neg Hx    Immunodeficiency Neg Hx    Angioedema Neg Hx    Atopy Neg Hx    Eczema Neg Hx    Urticaria Neg Hx     Social History  Socioeconomic History   Marital status: Married    Spouse name: Not on file   Number of children: Not on file   Years of education: Not on file   Highest education level: Not on file  Occupational History   Not on file  Tobacco Use   Smoking status: Never    Passive exposure: Never   Smokeless tobacco: Never   Tobacco comments:    never used tobacco  Vaping Use   Vaping Use: Never used  Substance and Sexual Activity   Alcohol use: Yes    Comment: very rare-social   Drug use: No   Sexual activity: Yes    Partners: Female  Other Topics Concern   Not on file  Social History Narrative   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 18 g 1   cetirizine (ZYRTEC) 10 MG tablet Take 10 mg by mouth daily.     Fluticasone Propionate,sensor, (ARMONAIR DIGIHALER) 113 MCG/ACT AEPB Inhale 1 puff into the lungs in the morning and at bedtime. 1 each 3   ipratropium (ATROVENT) 0.03 % nasal spray Place 2 sprays into both nostrils 3 (three) times daily as needed for rhinitis. 30 mL 5   Probiotic Product (PROBIOTIC ADVANCED PO) Take 3 capsules by mouth daily.     No facility-administered medications prior to visit.    No Known Allergies  Review of Systems  Constitutional:  Negative for fever.  HENT:  Positive for congestion. Negative for sinus pain and sore throat.   Respiratory:  Positive for cough and shortness of breath. Negative for wheezing.   Cardiovascular:  Negative for chest pain and palpitations.  Gastrointestinal:  Negative for blood in stool, constipation, diarrhea, nausea and vomiting.  Genitourinary:  Negative for dysuria, frequency and hematuria.  Musculoskeletal:  Negative for joint pain and myalgias.       Objective:      There were no vitals taken for this visit. Wt Readings from Last 3 Encounters:  08/21/22 194 lb (88 kg)  07/24/22 194 lb (88 kg)  07/17/22 194 lb (88 kg)    Gen: Awake, alert, no acute distress Resp: Breathing is even and non-labored Psych: calm/pleasant demeanor Neuro: Alert and Oriented x 3, + facial symmetry, speech is clear.      Assessment & Plan:   Problem List Items Addressed This Visit       Unprioritized   Viral URI with cough - Primary    New.  He has not  tested for covid but I encouraged him to pick up an OTC kit and test at home. Discussed quarantine guidelines if he does test positive for covid. Flu is also a possibility.  He has history of asthma- encouraged him to continue his albuterol every 6 hours as needed.  If increased wheezing/worsening asthma symptoms advised him to schedule in person evaluation for  lung exam.  Otherwise, will rx with promethazine DM, advised continue continue flonase. May add sudafed and mucinex prn otc for congestion.  Increase rest, hydration.  Note provided for work.         Meds ordered this encounter  Medications   promethazine-dextromethorphan (PROMETHAZINE-DM) 6.25-15 MG/5ML syrup    Sig: Take 5 mLs by mouth 4 (four) times daily as needed for cough.    Dispense:  118 mL    Refill:  0    Order Specific Question:   Supervising Provider    Answer:   Penni Homans A [[id number]]    I discussed the assessment and treatment plan with the patient. The patient was provided an opportunity to ask questions and all were answered. The patient agreed with the plan and demonstrated an understanding of the instructions.   The patient was advised to call back or seek an in-person evaluation if the symptoms worsen or if the condition fails to improve as anticipated.  I,Mathew Stumpf,acting as a Education administrator for [employer], NP.,have documented all relevant documentation on the behalf of Nance Pear, NP,as directed by  Nance Pear, NP while in the presence of Nance Pear, NP.   Nance Pear, NP [facility] [place] (phone) [phone] (fax)  [place]

## 2023-01-02 NOTE — Assessment & Plan Note (Signed)
New.  He has not tested for covid but I encouraged him to pick up an OTC kit and test at home. Discussed quarantine guidelines if he does test positive for covid. Flu is also a possibility.  He has history of asthma- encouraged him to continue his albuterol every 6 hours as needed.  If increased wheezing/worsening asthma symptoms advised him to schedule in person evaluation for lung exam.  Otherwise, will rx with promethazine DM, advised continue continue flonase. May add sudafed and mucinex prn otc for congestion.  Increase rest, hydration.  Note provided for work.

## 2023-01-03 ENCOUNTER — Encounter: Payer: Self-pay | Admitting: Family

## 2023-01-04 ENCOUNTER — Encounter: Payer: Self-pay | Admitting: Internal Medicine

## 2023-01-04 NOTE — Telephone Encounter (Signed)
As long as he feels better, I am fine with seeing him on that date.

## 2023-01-08 ENCOUNTER — Encounter: Payer: Self-pay | Admitting: Internal Medicine

## 2023-01-08 ENCOUNTER — Ambulatory Visit (INDEPENDENT_AMBULATORY_CARE_PROVIDER_SITE_OTHER): Payer: BC Managed Care – PPO | Admitting: Internal Medicine

## 2023-01-08 VITALS — BP 144/96 | HR 86 | Temp 98.0°F | Resp 18 | Ht 71.0 in | Wt 191.5 lb

## 2023-01-08 DIAGNOSIS — R051 Acute cough: Secondary | ICD-10-CM

## 2023-01-08 MED ORDER — BENZONATATE 100 MG PO CAPS: 100.0000 mg | ORAL_CAPSULE | Freq: Three times a day (TID) | ORAL | 0 refills | Status: DC | PRN

## 2023-01-08 MED ORDER — HYDROCODONE BIT-HOMATROP MBR 5-1.5 MG/5ML PO SOLN: ORAL | 0 refills | Status: DC

## 2023-01-08 NOTE — Patient Instructions (Signed)
Cough control:  Mucinex DM or Robitussin DM (guaifenesin and dextromethorphan)  I will send a prescription of a liquid medicine with hydrocodone, you can take it up to twice daily as needed for severe cough.  You probably will only needed at nighttime.  Will make you drowsy   For nasal congestion: Astepro 2 sprays on each side of the nose twice daily until better  Call if not gradually better  Call anytime if you feel worse

## 2023-01-08 NOTE — Progress Notes (Unsigned)
   Subjective:    Patient ID: Larry Brooks, male    DOB: 08-01-1976, 47 y.o.   MRN: [id number]  DOS:  01/08/2023 Type of visit - description: Acute  Symptoms started 12/31/2022. Seen virtually 2 days later. Reported abundant sputum production make it difficult to breathe. Bothersome cough keeping him awake. + Malaise. The next day he tested positive for COVID. Was recommended conservative treatment On 01/07/2023 she said he was feeling better but was unable to sleep due to drainage cough. Tried Delsym, NyQuil w/o much help In the past Tessalon Perles did not help much.  His main concern for this visit if cough control. Currently with no fever or chills Minimal sputum production. admits to nasal discharge   No myalgias, nausea or vomiting. No chest pain per se but the diaphragm feels painful with cough.  Review of Systems See above   Past Medical History:  Diagnosis Date   Childhood asthma    Generalized anxiety disorder    Leukopenia    Thrombocytopenia ([facility])     Past Surgical History:  Procedure Laterality Date   TONSILLECTOMY     WISDOM TOOTH EXTRACTION      Current Outpatient Medications  Medication Instructions   albuterol (VENTOLIN HFA) 108 (90 Base) MCG/ACT inhaler 2 puffs, Inhalation, Every 6 hours PRN   benzonatate (TESSALON) 100 mg, Oral, 3 times daily PRN   cetirizine (ZYRTEC) 10 mg, Oral, Daily   Fluticasone Propionate,sensor, (ARMONAIR DIGIHALER) 113 MCG/ACT AEPB 1 puff, Inhalation, 2 times daily   ipratropium (ATROVENT) 0.03 % nasal spray 2 sprays, Each Nare, 3 times daily PRN   Probiotic Product (PROBIOTIC ADVANCED PO) 3 capsules, Oral, Daily       Objective:   Physical Exam BP (!) 144/96   Pulse 86   Temp 98 F (36.7 C) (Oral)   Resp 18   Ht 5\' 11"  (1.803 m)   Wt 191 lb 8 oz (86.9 kg)   SpO2 98%   BMI 26.71 kg/m  General:   Well developed, NAD, BMI noted. HEENT:  Normocephalic . Face symmetric, atraumatic.  After I removed wax  bilaterally, TMs were normal. Throat: Symmetric. Lungs:  CTA B Normal respiratory effort, no intercostal retractions, no accessory muscle use. Heart: RRR,  no murmur.  Lower extremities: no pretibial edema bilaterally  Skin: Not pale. Not jaundice Neurologic:  alert & oriented X3.  Speech normal, gait appropriate for age and unassisted Psych--  Cognition and judgment appear intact.  Cooperative with normal attention span and concentration.  Behavior appropriate. No anxious or depressed appearing.      Assessment    47 year old male, history of asthma presents with:  Cough: Persistent  cough after episode of COVID. O2 sat check at rest and after exertion 98%. Lungs are clear, nose is congested. At this point gcovid sxs are resolving, asthma seems well-controlled, we need to take care of the cough. Plan:  Robitussin DM, hydrocodone (discussed risk of addiction, he is aware and accept the risk), Astepro to prevent nasal discharge, call if not gradually better

## 2023-01-09 ENCOUNTER — Ambulatory Visit: Payer: BC Managed Care – PPO | Admitting: Family

## 2023-01-11 ENCOUNTER — Ambulatory Visit: Payer: BC Managed Care – PPO | Admitting: Internal Medicine

## 2023-01-30 ENCOUNTER — Other Ambulatory Visit: Payer: Self-pay | Admitting: Family

## 2023-01-30 ENCOUNTER — Telehealth (INDEPENDENT_AMBULATORY_CARE_PROVIDER_SITE_OTHER): Payer: BC Managed Care – PPO | Admitting: Family

## 2023-01-30 DIAGNOSIS — U099 Post covid-19 condition, unspecified: Secondary | ICD-10-CM

## 2023-01-30 DIAGNOSIS — R053 Chronic cough: Secondary | ICD-10-CM | POA: Diagnosis not present

## 2023-01-30 MED ORDER — FLUTICASONE-SALMETEROL 100-50 MCG/ACT IN AEPB: 1.0000 | INHALATION_SPRAY | Freq: Two times a day (BID) | RESPIRATORY_TRACT | 3 refills | Status: DC

## 2023-01-30 MED ORDER — FLUTICASONE PROPIONATE HFA 110 MCG/ACT IN AERO: 1.0000 | INHALATION_SPRAY | Freq: Two times a day (BID) | RESPIRATORY_TRACT | 3 refills | Status: DC

## 2023-01-30 NOTE — Assessment & Plan Note (Signed)
He was unable to refill armonair inhaler as it has been discontinued. I will send rx for flovent instead for him to start.  He will continue albuterol prn. We dicussed that covid cough can sometimes last months. If symptoms do not continue to improve, he will reach out to me and we will refer him to pulmonology.

## 2023-01-30 NOTE — Progress Notes (Signed)
MyChart Video Visit    Virtual Visit via Video Note    Patient location: Home. Patient and provider in visit Provider location: Office  I discussed the limitations of evaluation and management by telemedicine and the availability of in person appointments. The patient expressed understanding and agreed to proceed.  Visit Date: 01/30/2023  Today's healthcare provider: Nance Pear, NP     Subjective:    Patient ID: Larry Brooks, male    DOB: [birth year], 47 y.o.   MRN: [id number]  Chief Complaint  Patient presents with   Cough    Complains of persistent cough after having covid in mid February   HPI The patient is in today for a video visit.   Cough: He complains of an intermittent dry cough for about 4 weeks. He tested positive for Covid on 01/02/2023. He saw Dr. Larose Kells on 01/08/2023 and was prescribed Robitussin DM and Hydrocodone, which seemed to help a bit. He had been using his inhaler for just in case when the cough was worse. He also reports that he has been using the Astepro instead of Flonase. He notes that today he is feeling better than yesterday.   Past Medical History:  Diagnosis Date   Childhood asthma    Generalized anxiety disorder    Leukopenia    Thrombocytopenia ([facility])     Past Surgical History:  Procedure Laterality Date   TONSILLECTOMY     WISDOM TOOTH EXTRACTION      Family History  Problem Relation Age of Onset   Coronary artery disease Father        status post 4 stents    Cancer Father        prostate   Hyperlipidemia Father    COPD Maternal Grandmother    Heart attack Paternal Grandfather    Hyperlipidemia Paternal Grandfather    Diabetes type II Other        grandfather   Hyperlipidemia Other        grandfather   Allergic rhinitis Son    Food Allergy Son        dairy, peanut   Food Allergy Son        peanut   Allergic rhinitis Son    Colon cancer Neg Hx    Asthma Neg Hx    Immunodeficiency Neg Hx    Angioedema Neg Hx     Atopy Neg Hx    Eczema Neg Hx    Urticaria Neg Hx     Social History   Socioeconomic History   Marital status: Married    Spouse name: Not on file   Number of children: Not on file   Years of education: Not on file   Highest education level: Not on file  Occupational History   Not on file  Tobacco Use   Smoking status: Never    Passive exposure: Never   Smokeless tobacco: Never   Tobacco comments:    never used tobacco  Vaping Use   Vaping Use: Never used  Substance and Sexual Activity   Alcohol use: Yes    Comment: very rare-social   Drug use: No   Sexual activity: Yes    Partners: Female  Other Topics Concern   Not on file  Social History Narrative   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 18 g 1   benzonatate (TESSALON) 100 MG capsule Take 1 capsule (100 mg total) by mouth 3 (three) times daily as needed. 20 capsule 0   cetirizine (ZYRTEC) 10 MG tablet Take 10 mg by mouth daily.     HYDROcodone bit-homatropine (HYCODAN) 5-1.5 MG/5ML syrup 1 teaspoon twice daily as needed for cough. 120 mL 0   ipratropium (ATROVENT) 0.03 % nasal spray Place 2 sprays into both nostrils 3 (three) times daily as needed for rhinitis. 30 mL 5   Probiotic Product (PROBIOTIC ADVANCED PO) Take 3 capsules by mouth daily.     Fluticasone Propionate,sensor, (ARMONAIR DIGIHALER) 113 MCG/ACT AEPB Inhale 1 puff into  the lungs in the morning and at bedtime. 1 each 3   No facility-administered medications prior to visit.    No Known Allergies  Review of Systems  Respiratory:  Positive for cough (intermittent dry cough).        Objective:    Physical Exam Constitutional:      General: He is awake. He is not in acute distress. Pulmonary:     Effort: Pulmonary effort is normal.  Neurological:     Mental Status: He is alert and oriented to person, place, and time.     Cranial Nerves: No facial asymmetry.  Psychiatric:        Mood and Affect: Mood normal.        Speech: Speech normal.     There were no vitals taken for this visit. Wt Readings from Last 3 Encounters:  01/08/23 191 lb 8 oz (86.9 kg)  08/21/22 194 lb (88 kg)  07/24/22 194 lb (88 kg)       Assessment & Plan:   Problem List Items Addressed This Visit       Unprioritized   COVID-19 long hauler manifesting chronic cough - Primary    He was unable to refill armonair inhaler as it has been discontinued. I will send rx for flovent instead for him to start.  He will continue albuterol prn. We dicussed that covid cough can sometimes last months. If symptoms do not continue to improve, he will reach out to me and we will refer him to pulmonology.       Meds ordered this encounter  Medications   fluticasone (FLOVENT HFA) 110 MCG/ACT inhaler    Sig: Inhale 1 puff into the lungs 2 (two) times daily.    Dispense:  1 each    Refill:  3    Order Specific Question:   Supervising Provider    Answer:   Penni Homans A [[id number]]    I discussed the assessment and treatment plan with the patient. The patient was provided an opportunity to ask questions and all were answered. The patient agreed with the plan and demonstrated an understanding of the instructions.   The patient was advised to call back or seek an in-person evaluation if the symptoms worsen or if the condition fails to improve as anticipated.  I,Rachel Rivera,acting as a  Education administrator for [employer]  Inda Castle, NP.,have documented all relevant documentation on the behalf of Nance Pear, NP,as directed by  Nance Pear, NP while in the presence of Nance Pear, NP.  Nance Pear, NP [facility] [place] (phone) [phone] (fax)  [place]

## 2023-03-12 ENCOUNTER — Encounter: Payer: Self-pay | Admitting: *Deleted

## 2023-03-15 ENCOUNTER — Ambulatory Visit: Payer: BC Managed Care – PPO | Admitting: Internal Medicine

## 2023-03-18 NOTE — Progress Notes (Unsigned)
FOLLOW UP Date of Service/Encounter:  03/20/23   Subjective:  Larry Brooks (DOB: 04-10-1976) is a 47 y.o. male who [facility] in re-evaluation of the following: mild persistent asthma, allergic rhinitis with recurrent sinusitis  History obtained from: chart review and patient.  For Review, LV was on 07/06/22  with Dr.Norberto Wishon seen for routine follow-up. See below for summary of history and diagnostics.  Therapeutic plans/changes recommended: doing well. Contact dermatitis resolved (suspected secondary to plant exposures), no changes made to regimen.   Pertinent History/Diagnostics:  Asthma:  mild persistent, active during respiratory illness  - pre/post spirometry (12/22/21) while asymptomatic: ratio 100%, 75% FEV1 (pre), 75% FEV1 (post)-nonobstructive, no improvement with bronchodilator, scooping noted on expiratory loop Allergic Rhinitis:  - SPT environmental panel (12/22/21): positive to grasses, weeds, trees, indoor/outdoor molds, dust mites, cat, dogs, mixed feathers, cockroach, mouse  - interest in AIT-potentially if symptoms worsen, but not at this time Food Intolerance (almonds)  - Hx of reaction: -gets mouth sores if eats too many, so avoids large quantities - SPT select foods (almond): 2 x 2 (same as negative control)  - labs: igE almond < 0.10  Recurrent sinusitis:  Getting antibiotics 3-4 times per year-usually for sinus infection that goes to bronchitis, pneumonia twice - hospitalized in High School, No history of skin infection.  No recurrent ear infections.  No hospitalizations outside of high school.   Increased number of infections in the past 2-3 years (2020-2023)   - immune evaluation labs: CH50, IgM, IgA, IgG, CBCd, diphteria, tetanus and strep titers all within normal limits - no previous sinus imaging, last visit with ENT when he was a small child  Today presents for follow-up. He had to use his [employer] which has now  been discontinued when he had COVID in January and was symptomatic for 8 weeks with a cough. He is much better now.  He did not get any oral steroids or antibiotics during that time or since his last visit. He saw his PCP 3 times during that time. His allergies are fairly well controlled.  Zyrtec is helping. Still not interested in AIT. He does have flonase, but doesn't need it often. No sinus infections since we last saw him.  Allergies as of 03/20/2023   No Known Allergies      Medication List        Accurate as of March 20, 2023  4:01 PM. If you have any questions, ask your nurse or doctor.          albuterol 108 (90 Base) MCG/ACT inhaler Commonly known as: VENTOLIN HFA Inhale 2 puffs into the lungs every 6 (six) hours as needed for wheezing or shortness of breath.   benzonatate 100 MG capsule Commonly known as: TESSALON Take 1 capsule (100 mg total) by mouth 3 (three) times daily as needed.   cetirizine 10 MG tablet Commonly known as: ZYRTEC Take 10 mg by mouth daily.   fluticasone-salmeterol 100-50 MCG/ACT Aepb Commonly known as: Wixela Inhub Inhale 1 puff into the lungs 2 (two) times daily.   HYDROcodone bit-homatropine 5-1.5 MG/5ML syrup Commonly known as: HYCODAN 1 teaspoon twice daily as needed for cough.   ipratropium 0.03 % nasal spray Commonly known as: ATROVENT Place 2 sprays into both nostrils 3 (three) times daily as needed for rhinitis.   PROBIOTIC ADVANCED PO Take 3 capsules by mouth daily.       Past Medical History:  Diagnosis Date  Childhood asthma    Generalized anxiety disorder    Leukopenia    Thrombocytopenia    Past Surgical History:  Procedure Laterality Date   TONSILLECTOMY     WISDOM TOOTH EXTRACTION     Otherwise, there have been no changes to his past medical history, surgical history, family history, or social history.  ROS: All others negative except as noted per HPI.   Objective:  BP 120/70   Pulse 95   Temp 98.3  F (36.8 C) (Temporal)   Resp 18   SpO2 97%  There is no height or weight on file to calculate BMI. Physical Exam: General Appearance:  Alert, cooperative, no distress, appears stated age  Head:  Normocephalic, without obvious abnormality, atraumatic  Eyes:  Conjunctiva clear, EOM's intact  Nose: Nares normal, hypertrophic turbinates, normal mucosa, and no visible anterior polyps  Throat: Lips, tongue normal; teeth and gums normal, normal posterior oropharynx  Neck: Supple, symmetrical  Lungs:   clear to auscultation bilaterally, Respirations unlabored, no coughing  Heart:  regular rate and rhythm and no murmur, Appears well perfused  Extremities: No edema  Skin: Skin color, texture, turgor normal and no rashes or lesions on visualized portions of skin  Neurologic: No gross deficits   Spirometry:  Tracings reviewed. His effort: Good reproducible efforts. FVC: 3.82L FEV1: 3.35L, 81% predicted FEV1/FVC ratio: 104% Interpretation:  nonobstructive ratio, possible restriction .  Please see scanned spirometry results for details.  Assessment/Plan   Seasonal and perennial allergic rhinitis: controlled - allergen avoidance towards grasses, weeds, trees, indoor/outdoor molds, dust mites, cat, dogs, mixed feathers, cockroach, mouse - consider allergy shots as long term control of your symptoms by teaching your immune system to be more tolerant of your allergy triggers - Continue Flonase 1-2 sprays daily as needed. - Continue over the counter antihistamine daily or daily as needed.   -Your options include Zyrtec (Cetirizine) , Claritin (Loratadine) , Allegra (Fexofenadine) , or Xyzal (Levocetirinze)   Recurrent sinusitis: controlled - immune labs were reassuring, no further immune evaluation indicated - continue to keep track of sinus infections and how often you are needing antibiotics, may consider sinus imaging in the future if this continues to be an issue  Mild  Persistent Asthma: controlled Breathing test looked great - Controller Inhaler: To be used at start of respiratory illness:  Start Pulmicort 90 mcg, 2 puffs twice a day; Use In Block Therapy-Start if having respiratory symptoms.  Use for at least 2 week or until symptoms resolve. - Rinse mouth out after use - Rescue Inhaler: Albuterol (Proair/Ventolin) 2 puffs . Use  every 4-6 hours as needed for chest tightness, wheezing, or coughing.  Can also use 15 minutes prior to exercise if you have symptoms with activity. - Asthma is not controlled if:  - Symptoms are occurring >2 times a week OR  - >2 times a month nighttime awakenings  - You are requiring systemic steroids (prednisone/steroid injections) more than once per year  - Your require hospitalization for your asthma.  - Please call the clinic to schedule a follow up if these symptoms arise  Almond Intolerance - avoidance is optional based on tolerance of symptoms  Follow-up in 6 months, sooner if needed. It was a pleasure seeing you again in clinic today!  Tonny Bollman, MD  [facility]

## 2023-03-20 ENCOUNTER — Encounter: Payer: Self-pay | Admitting: Internal Medicine

## 2023-03-20 ENCOUNTER — Ambulatory Visit (INDEPENDENT_AMBULATORY_CARE_PROVIDER_SITE_OTHER): Payer: BC Managed Care – PPO | Admitting: Internal Medicine

## 2023-03-20 VITALS — BP 120/70 | HR 95 | Temp 98.3°F | Resp 18

## 2023-03-20 DIAGNOSIS — J4530 Mild persistent asthma, uncomplicated: Secondary | ICD-10-CM

## 2023-03-20 DIAGNOSIS — J3089 Other allergic rhinitis: Secondary | ICD-10-CM

## 2023-03-20 DIAGNOSIS — J329 Chronic sinusitis, unspecified: Secondary | ICD-10-CM | POA: Diagnosis not present

## 2023-03-20 DIAGNOSIS — J302 Other seasonal allergic rhinitis: Secondary | ICD-10-CM | POA: Diagnosis not present

## 2023-03-20 MED ORDER — PULMICORT FLEXHALER 90 MCG/ACT IN AEPB: 2.0000 | INHALATION_SPRAY | Freq: Two times a day (BID) | RESPIRATORY_TRACT | 3 refills | Status: DC

## 2023-03-20 NOTE — Patient Instructions (Addendum)
Seasonal and perennial allergic rhinitis: - allergen avoidance towards grasses, weeds, trees, indoor/outdoor molds, dust mites, cat, dogs, mixed feathers, cockroach, mouse - consider allergy shots as long term control of your symptoms by teaching your immune system to be more tolerant of your allergy triggers - Continue Flonase 1-2 sprays daily as needed. - Continue over the counter antihistamine daily or daily as needed.   -Your options include Zyrtec (Cetirizine) , Claritin (Loratadine) , Allegra (Fexofenadine) , or Xyzal (Levocetirinze)   Recurrent sinusitis:  - immune labs were reassuring, no further immune evaluation indicated - continue to keep track of sinus infections and how often you are needing antibiotics, may consider sinus imaging in the future if this continues to be an issue  Mild Persistent Asthma: Breathing test looked great - Controller Inhaler: To be used at start of respiratory illness:  Start Pulmicort 90 mcg, 2 puffs twice a day; Use In Block Therapy-Start if having respiratory symptoms.  Use for at least 2 week or until symptoms resolve. - Rinse mouth out after use - Rescue Inhaler: Albuterol (Proair/Ventolin) 2 puffs . Use  every 4-6 hours as needed for chest tightness, wheezing, or coughing.  Can also use 15 minutes prior to exercise if you have symptoms with activity. - Asthma is not controlled if:  - Symptoms are occurring >2 times a week OR  - >2 times a month nighttime awakenings  - You are requiring systemic steroids (prednisone/steroid injections) more than once per year  - Your require hospitalization for your asthma.  - Please call the clinic to schedule a follow up if these symptoms arise  Almond Intolerance - avoidance is optional based on tolerance of symptoms  Follow-up in 6 months, sooner if needed. It was a pleasure seeing you again in clinic today!  Tonny Bollman, MD [facility]  Reducing Pollen Exposure  The  American Academy of Allergy, Asthma and Immunology suggests the following steps to reduce your exposure to pollen during allergy seasons.    Do not hang sheets or clothing out to dry; pollen may collect on these items. Do not mow lawns or spend time around freshly cut grass; mowing stirs up pollen. Keep windows closed at night.  Keep car windows closed while driving. Minimize morning activities outdoors, a time when pollen counts are usually at their highest. Stay indoors as much as possible when pollen counts or humidity is high and on windy days when pollen tends to remain in the air longer. Use air conditioning when possible.  Many air conditioners have filters that trap the pollen spores. Use a HEPA room air filter to remove pollen form the indoor air you breathe.  DUST MITE AVOIDANCE MEASURES:  There are three main measures that need and can be taken to avoid house dust mites:  Reduce accumulation of dust in general -reduce furniture, clothing, carpeting, books, stuffed animals, especially in bedroom  Separate yourself from the dust -use pillow and mattress encasements (can be found at stores such as [employer] or online) -avoid direct exposure to air condition flow -use a HEPA filter device, especially in the bedroom; you can also use a HEPA filter vacuum cleaner -wipe dust with a moist towel instead of a dry towel or broom when cleaning  Decrease mites and/or their secretions -wash clothing and linen and stuffed animals at highest temperature possible, at least every 2 weeks -stuffed animals can also be placed in a bag and put in a freezer overnight  Despite the above measures, it is impossible to eliminate dust mites or their allergen completely from your home.  With the above measures the burden of mites in your home can be diminished, with the goal of minimizing your allergic symptoms.  Success will be reached only when implementing and using all means  together.  Control of Dog or Cat Allergen  Avoidance is the best way to manage a dog or cat allergy. If you have a dog or cat and are allergic to dog or cats, consider removing the dog or cat from the home. If you have a dog or cat but don't want to find it a new home, or if your family wants a pet even though someone in the household is allergic, here are some strategies that may help keep symptoms at bay:  Keep the pet out of your bedroom and restrict it to only a few rooms. Be advised that keeping the dog or cat in only one room will not limit the allergens to that room. Don't pet, hug or kiss the dog or cat; if you do, wash your hands with soap and water. High-efficiency particulate air (HEPA) cleaners run continuously in a bedroom or living room can reduce allergen levels over time. Regular use of a high-efficiency vacuum cleaner or a central vacuum can reduce allergen levels. Giving your dog or cat a bath at least once a week can reduce airborne allergen.  Control of Cockroach Allergen  Cockroach allergen has been identified as an important cause of acute attacks of asthma, especially in urban settings.  There are fifty-five species of cockroach that exist in the [place], however only three, the [place], [place] species produce allergen that can affect patients with Asthma.  Allergens can be obtained from fecal particles, egg casings and secretions from cockroaches.    Remove food sources. Reduce access to water. Seal access and entry points. Spray runways with 0.5-1% Diazinon or Chlorpyrifos Blow boric acid power under stoves and refrigerator. Place bait stations (hydramethylnon) at feeding sites.

## 2023-04-04 ENCOUNTER — Ambulatory Visit (INDEPENDENT_AMBULATORY_CARE_PROVIDER_SITE_OTHER): Payer: BC Managed Care – PPO | Admitting: Family

## 2023-04-04 VITALS — BP 108/74 | HR 74 | Temp 97.7°F | Resp 18 | Ht 71.0 in | Wt 188.0 lb

## 2023-04-04 DIAGNOSIS — J069 Acute upper respiratory infection, unspecified: Secondary | ICD-10-CM | POA: Diagnosis not present

## 2023-04-04 LAB — POC COVID19 BINAXNOW: SARS Coronavirus 2 Ag: NEGATIVE

## 2023-04-04 MED ORDER — PROMETHAZINE-DM 6.25-15 MG/5ML PO SYRP: 5.0000 mL | ORAL_SOLUTION | Freq: Four times a day (QID) | ORAL | 0 refills | Status: DC | PRN

## 2023-04-04 NOTE — Assessment & Plan Note (Addendum)
New.  Rapid covid test is negative. Recommended supportive measures including rest, hydration, prn NSAIDS (he has been using aleve).  Will send rx for promethazine DM cough syrup to his pharmacy. He is advised to call if new/worsening symptoms or if symptoms continue to improve.

## 2023-04-04 NOTE — Progress Notes (Addendum)
Subjective:   By signing my name below, I, Barrett Shell, attest that this documentation has been prepared under the direction and in the presence of Sandford Craze, NP. 04/04/2023   Patient ID: Larry Brooks, male    DOB: 1976-01-04, 47 y.o.   MRN: [id number]  Chief Complaint  Patient presents with   Cough    Onset: 3 days     Cough Associated symptoms include a sore throat.   Patient is in today for an office visit.   Cough He complains of a cough,  rhinorrhea, and a sore throat for the past 5 days. The cough caused him to start having pain in a nerve in his leg. He has been taking vitamin C,  tukol max, aleve to manage his symptoms.    Past Medical History:  Diagnosis Date   Childhood asthma    Generalized anxiety disorder    Leukopenia    Thrombocytopenia (HCC)     Past Surgical History:  Procedure Laterality Date   TONSILLECTOMY     WISDOM TOOTH EXTRACTION      Family History  Problem Relation Age of Onset   Coronary artery disease Father        status post 4 stents    Cancer Father        prostate   Hyperlipidemia Father    COPD Maternal Grandmother    Heart attack Paternal Grandfather    Hyperlipidemia Paternal Grandfather    Diabetes type II Other        grandfather   Hyperlipidemia Other        grandfather   Allergic rhinitis Son    Food Allergy Son        dairy, peanut   Food Allergy Son        peanut   Allergic rhinitis Son    Colon cancer Neg Hx    Asthma Neg Hx    Immunodeficiency Neg Hx    Angioedema Neg Hx    Atopy Neg Hx    Eczema Neg Hx    Urticaria Neg Hx     Social History   Socioeconomic History   Marital status: Married    Spouse name: Not on file   Number of children: Not on file   Years of education: Not on file   Highest education level: Master's degree (e.g., MA, MS, MEng, MEd, MSW, MBA)  Occupational History   Not on file  Tobacco Use   Smoking status: Never    Passive exposure: Never   Smokeless tobacco:  Never   Tobacco comments:    never used tobacco  Vaping Use   Vaping Use: Never used  Substance and Sexual Activity   Alcohol use: Yes    Comment: very rare-social   Drug use: No   Sexual activity: Yes    Partners: Female  Other Topics Concern   Not on file  Social History Narrative   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   Married  2 sons, 2005 and 2012   Enjoys cooking, playing with his kids, reading, fishing   Social Determinants of Health   Financial Resource Strain: Low Risk  (04/03/2023)   Overall Financial Resource Strain (CARDIA)    Difficulty of Paying Living Expenses: Not hard at all  Food Insecurity: No Food Insecurity (04/03/2023)   Hunger Vital Sign    Worried About Running Out of Food in the Last Year: Never true    Ran Out of Food in the Last  Year: Never true  Transportation Needs: No Transportation Needs (04/03/2023)   PRAPARE - Administrator, Civil Service (Medical): No    Lack of Transportation (Non-Medical): No  Physical Activity: Insufficiently Active (04/03/2023)   Exercise Vital Sign    Days of Exercise per Week: 2 days    Minutes of Exercise per Session: 30 min  Stress: No Stress Concern Present (04/03/2023)   [employer] of Occupational Health - Occupational Stress Questionnaire    Feeling of Stress : Not at all  Social Connections: Socially Integrated (04/03/2023)   Social Connection and Isolation Panel [NHANES]    Frequency of Communication with Friends and Family: More than three times a week    Frequency of Social Gatherings with Friends and Family: Twice a week    Attends Religious Services: More than 4 times per year    Active Member of [employer] or Organizations: Yes    Attends Engineer, structural: More than 4 times per year    Marital Status: Married  Catering manager Violence: Not on file    Outpatient Medications Prior to Visit  Medication Sig Dispense Refill   albuterol (VENTOLIN HFA) 108 (90 Base) MCG/ACT inhaler  Inhale 2 puffs into the lungs every 6 (six) hours as needed for wheezing or shortness of breath. 18 g 1   Budesonide (PULMICORT FLEXHALER) 90 MCG/ACT inhaler Inhale 2 puffs into the lungs 2 (two) times daily. Start at onset of respiratory illness and continue for 2 weeks or until symptoms resolve. 1 each 3   cetirizine (ZYRTEC) 10 MG tablet Take 10 mg by mouth daily.     ipratropium (ATROVENT) 0.03 % nasal spray Place 2 sprays into both nostrils 3 (three) times daily as needed for rhinitis. 30 mL 5   Probiotic Product (PROBIOTIC ADVANCED PO) Take 3 capsules by mouth daily.     No facility-administered medications prior to visit.    No Known Allergies  Review of Systems  HENT:  Positive for congestion and sore throat.   Respiratory:  Positive for cough.        Objective:    Physical Exam Constitutional:      General: He is not in acute distress.    Appearance: Normal appearance.  HENT:     Head: Normocephalic and atraumatic.     Comments: Left cerumen infection    Right Ear: External ear normal.     Left Ear: External ear normal. There is impacted cerumen.  Eyes:     Extraocular Movements: Extraocular movements intact.     Pupils: Pupils are equal, round, and reactive to light.  Cardiovascular:     Rate and Rhythm: Normal rate and regular rhythm.     Heart sounds: No murmur heard.    No gallop.  Pulmonary:     Effort: Pulmonary effort is normal. No respiratory distress.     Breath sounds: Normal breath sounds. No wheezing or rales.  Skin:    General: Skin is warm.  Neurological:     Mental Status: He is alert and oriented to person, place, and time.  Psychiatric:        Judgment: Judgment normal.     BP 108/74   Pulse 74   Temp 97.7 F (36.5 C)   Resp 18   Ht 5\' 11"  (1.803 m)   Wt 188 lb (85.3 kg)   SpO2 98%   BMI 26.22 kg/m  Wt Readings from Last 3 Encounters:  04/04/23 188 lb (85.3 kg)  01/08/23 191 lb 8 oz (86.9 kg)  08/21/22 194 lb (88 kg)        Assessment & Plan:  URI with cough and congestion -     Promethazine-DM; Take 5 mLs by mouth 4 (four) times daily as needed for cough.  Dispense: 118 mL; Refill: 0  Viral URI with cough Assessment & Plan: New.  Rapid covid test is negative. Recommended supportive measures including rest, hydration, prn NSAIDS (he has been using aleve).  Will send rx for promethazine DM cough syrup to his pharmacy. He is advised to call if new/worsening symptoms or if symptoms continue to improve.   Orders: -     POC COVID-19 BinaxNow    I, Lemont Fillers, NP, personally preformed the services described in this documentation.  All medical record entries made by the scribe were at my direction and in my presence.  I have reviewed the chart and discharge instructions (if applicable) and agree that the record reflects my personal performance and is accurate and complete. 04/04/2023  Lemont Fillers, NP   [facility] as a scribe for Lemont Fillers, NP.,have documented all relevant documentation on the behalf of Lemont Fillers, NP,as directed by  Lemont Fillers, NP while in the presence of Lemont Fillers, NP.

## 2023-04-09 ENCOUNTER — Encounter: Payer: Self-pay | Admitting: Family

## 2023-04-09 ENCOUNTER — Other Ambulatory Visit: Payer: Self-pay

## 2023-04-10 ENCOUNTER — Other Ambulatory Visit: Payer: Self-pay

## 2023-04-10 MED ORDER — ALBUTEROL SULFATE HFA 108 (90 BASE) MCG/ACT IN AERS: 2.0000 | INHALATION_SPRAY | Freq: Four times a day (QID) | RESPIRATORY_TRACT | 1 refills | Status: DC | PRN

## 2023-04-10 NOTE — Telephone Encounter (Signed)
Rx sent, patient notified

## 2023-05-30 ENCOUNTER — Ambulatory Visit (INDEPENDENT_AMBULATORY_CARE_PROVIDER_SITE_OTHER): Payer: BC Managed Care – PPO

## 2023-05-30 ENCOUNTER — Ambulatory Visit (INDEPENDENT_AMBULATORY_CARE_PROVIDER_SITE_OTHER): Payer: BC Managed Care – PPO | Admitting: Family Medicine

## 2023-05-30 VITALS — BP 148/88 | HR 104 | Ht 71.0 in | Wt 196.0 lb

## 2023-05-30 DIAGNOSIS — M545 Low back pain, unspecified: Secondary | ICD-10-CM | POA: Diagnosis not present

## 2023-05-30 DIAGNOSIS — M5416 Radiculopathy, lumbar region: Secondary | ICD-10-CM

## 2023-05-30 MED ORDER — GABAPENTIN 300 MG PO CAPS: 300.0000 mg | ORAL_CAPSULE | Freq: Every day | ORAL | 1 refills | Status: DC

## 2023-05-30 MED ORDER — PREDNISONE 50 MG PO TABS: 50.0000 mg | ORAL_TABLET | Freq: Every day | ORAL | 0 refills | Status: DC

## 2023-05-30 NOTE — Patient Instructions (Addendum)
Thank you for coming in today.   Please get an Xray today before you leave   I've referred you to Physical Therapy.  Let us know if you don't hear from them in one week.   I've sent a prescription for Gabapentin to your pharmacy.   Check back in 6 weeks.

## 2023-05-30 NOTE — Progress Notes (Signed)
   Rubin Payor, PhD, LAT, ATC acting as a scribe for Clementeen Graham, MD.  Larry Brooks is a 47 y.o. male who presents to [facility] today for R leg pain. Pt was previously seen by Dr. Jordan Likes in 2023 for R shoulder pain.  Today, pt c/o R leg pain ongoing since the beginning of the year, worsening mid-May. Pt locates pain to the toes of his R foot and the lateral aspect of his R lower leg. Symptoms vary and seem to be exacerbated by activity.  Pain is worse at the right lateral calf.  Low back pain: no- soreness in the lateral aspect of his R hip Radiating pain: yes LE numbness/tingling: yes LE weakness: yes Aggravates: increased activity Treatments tried: stretching  Pertinent review of systems: No fevers or chills.  No calf swelling chest pain or palpitations.  Relevant historical information: Right shoulder labrum tear   Exam:  BP (!) 148/88   Pulse (!) 104   Ht 5\' 11"  (1.803 m)   Wt 196 lb (88.9 kg)   SpO2 98%   BMI 27.34 kg/m  General: Well Developed, well nourished, and in no acute distress.   MSK: L-spine: Normal appearing Nontender palpation spinal midline. Normal lumbar motion. Lower extremity strength and reflexes are intact. Positive right-sided slump test.  Right calf and lower leg: Normal-appearing Nontender. Normal knee and ankle motion. Normal strength testing.  Unable to reproduce pain with palpation or strength testing right lateral calf.    Lab and Radiology Results  X-ray images lumbar spine obtained today personally and independently interpreted No severe DDD.  No acute fractures or malalignment. Await formal radiology review    Assessment and Plan: 47 y.o. male with right lateral leg pain thought to be L5 lumbar radiculopathy.  Plan for x-ray today and physical therapy trial.  Will prescribe prednisone and gabapentin as well.  If not improving with physical therapy or worsening we will proceed to  MRI.   PDMP not reviewed this encounter. Orders Placed This Encounter  Procedures   DG Lumbar Spine 2-3 Views    Standing Status:   Future    Number of Occurrences:   1    Standing Expiration Date:   06/30/2023    Order Specific Question:   Reason for Exam (SYMPTOM  OR DIAGNOSIS REQUIRED)    Answer:   lumbar radiculopahty    Order Specific Question:   Preferred imaging location?    Answer:   [facility]   Ambulatory referral to Physical Therapy    Referral Priority:   Routine    Referral Type:   Physical Medicine    Referral Reason:   Specialty Services Required    Requested Specialty:   Physical Therapy    Number of Visits Requested:   1   Meds ordered this encounter  Medications   gabapentin (NEURONTIN) 300 MG capsule    Sig: Take 1 capsule (300 mg total) by mouth at bedtime.    Dispense:  30 capsule    Refill:  1   predniSONE (DELTASONE) 50 MG tablet    Sig: Take 1 tablet (50 mg total) by mouth daily.    Dispense:  5 tablet    Refill:  0     Discussed warning signs or symptoms. Please see discharge instructions. Patient expresses understanding.   The above documentation has been reviewed and is accurate and complete Clementeen Graham, M.D.

## 2023-06-06 NOTE — Progress Notes (Signed)
Lumbar spine x-ray looks okay to radiology.  No fractures.  No severe arthritis.

## 2023-06-11 NOTE — Therapy (Signed)
OUTPATIENT PHYSICAL THERAPY THORACOLUMBAR EVALUATION   Patient Name: Larry Brooks MRN: [id number] DOB:Apr 23, 1976, 47 y.o., male Today's Date: 06/12/2023  END OF SESSION:  PT End of Session - 06/12/23 1227     Visit Number 1    Date for PT Re-Evaluation 08/21/23    Authorization Type [employer]    PT Start Time 1230    PT Stop Time 1315    PT Time Calculation (min) 45 min    Activity Tolerance Patient tolerated treatment well    Behavior During Therapy [facility] for tasks assessed/performed             Past Medical History:  Diagnosis Date   Childhood asthma    Generalized anxiety disorder    Leukopenia    Thrombocytopenia (HCC)    Past Surgical History:  Procedure Laterality Date   TONSILLECTOMY     WISDOM TOOTH EXTRACTION     Patient Active Problem List   Diagnosis Date Noted   COVID-19 long hauler manifesting chronic cough 01/30/2023   Viral URI with cough 01/02/2023   Labral tear of shoulder, degenerative, right 07/17/2022   Seasonal and perennial allergic rhinitis 03/27/2022   Adverse food reaction 03/27/2022   Mild persistent asthma 03/27/2022   Recurrent sinusitis 03/27/2022   Allergies 11/09/2021   Preventative health care 07/15/2021   Cardiac risk counseling 12/16/2011   Bronchitis 09/24/2011   ALLERGIC RHINITIS 10/31/2010   GENERALIZED ANXIETY DISORDER 12/08/2008   HYPERLIPIDEMIA 12/19/2007    PCP: Sandford Craze  REFERRING PROVIDER: Clementeen Graham  REFERRING DIAG: M54.16 Lumbar Radiculopathy   Rationale for Evaluation and Treatment: Rehabilitation  THERAPY DIAG:  Radiculopathy, lumbar region  Piriformis syndrome of right side  ONSET DATE: 07/18/23  SUBJECTIVE:                                                                                                                                                                                           SUBJECTIVE STATEMENT: My back does not hurt at all, there is a nerve that shoots down my leg and in the  back of my hamstring. The worst part is the electric shock feeling in to my toes on the R side.   PERTINENT HISTORY:  Today, pt c/o R leg pain ongoing since the beginning of the year, worsening mid-May. Pt locates pain to the toes of his R foot and the lateral aspect of his R lower leg. Symptoms vary and seem to be exacerbated by activity.  Pain is worse at the right lateral calf.   PAIN:  Are you having pain? Yes: NPRS scale: 4/10 Pain location: lateral R hip and thigh Pain description:  sharp, shooting, electrical, sting, worse in the morning  Aggravating factors: I cannot tell right now  Relieving factors: moving around sometimes  PRECAUTIONS: None  RED FLAGS: None   WEIGHT BEARING RESTRICTIONS: No  FALLS:  Has patient fallen in last 6 months? 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 Alignment is normal. Intervertebral disc spaces are maintained.   IMPRESSION: Negative exam  PATIENT SURVEYS:  FOTO 72  COGNITION: Overall cognitive status: Within functional limits for tasks assessed     SENSATION: WFL  MUSCLE LENGTH: Hamstrings: very tight in BLE  POSTURE: No Significant postural limitations  PALPATION: No remarkable findings, just very tight   LUMBAR ROM:   AROM eval  Flexion 75% a little painful and tight  Extension WFL  Right lateral flexion "A little bit of tightness, can feel that in the back on leg"  Left lateral flexion "A little bit of tightness"  Right rotation WFL  Left rotation WFL   (Blank rows = not tested)  LOWER EXTREMITY ROM:   grossly WFL    LOWER EXTREMITY MMT:  5/5    LUMBAR SPECIAL TESTS:  Straight leg raise test: Positive and Slump test:  Positive   TODAY'S TREATMENT:                                                                                                                              DATE: EVAL 06/12/23    PATIENT EDUCATION:  Education details: HEP, POC, piriformis syndrome and sciatica  Person educated: Patient Education method: Explanation Education comprehension: verbalized understanding  HOME EXERCISE PROGRAM: Access Code: [email or web address] URL: [email or web address] Date: 06/12/2023 Prepared by: Cassie Freer  Exercises - Supine Piriformis Stretch  - 1 x daily - 7 x weekly - 2 sets - 10 reps - Supine Sciatic Nerve Glide  - 1 x daily - 7 x weekly - 2 sets - 10 reps - Supine Hamstring Stretch with Strap  - 1 x daily - 7 x weekly - 2 reps - 30 hold - Gastroc Stretch on Wall  - 1 x daily - 7 x weekly - 1 sets - 2 reps - 30 hold - Seated Hamstring Stretch  - 1 x daily - 7 x weekly - 1 sets - 2 reps - 30 hold - Seated Piriformis Stretch with Trunk Bend  - 1 x daily - 7 x weekly - 1 sets - 2 reps - 30 hold  ASSESSMENT:  CLINICAL IMPRESSION: Patient is a 47 y.o. male who was seen today for physical therapy evaluation and treatment for radiculopathy. He reports that he is not having any pain in his back but his issues are more on his RLE. He has  numbness and tingling that runs all the way down in to his R foot. Patient presents with very tight hamstrings and hip rotators. He has signs and symptoms that align with possible piriformis syndrome. Was given a number of stretches to work on his flexibility. Patient will benefit from skilled PT to address his numbness/tingling and pain to be able to play tennis and go a full day without having shooting electrical pains.   OBJECTIVE IMPAIRMENTS: Abnormal gait, difficulty walking, decreased ROM, hypomobility, and pain.   REHAB POTENTIAL: Good  CLINICAL DECISION MAKING: Stable/uncomplicated  EVALUATION COMPLEXITY: Low   GOALS: Goals reviewed with patient?  Yes  SHORT TERM GOALS: Target date: 07/17/23  Patient will be independent with initial HEP.  Goal status: INITIAL   LONG TERM GOALS: Target date: 08/01/23  Patient will be independent with advanced/ongoing HEP to improve outcomes and carryover.  Goal status: INITIAL  2.  Patient will decrease numbness and tingling in RLE at least 50%  Goal status: INITIAL  3.  Patient will demonstrate full pain free lumbar ROM to perform ADLs.   Baseline: see chart above Goal status: INITIAL  4.  Patient will demonstrate improved flexibility in hamstrings and piriformis Baseline: visible shaking and some pain with stretching  Goal status: INITIAL   PLAN:  PT FREQUENCY: 1x/week  PT DURATION: 10 weeks  PLANNED INTERVENTIONS: Therapeutic exercises, Therapeutic activity, Neuromuscular re-education, Balance training, Gait training, Patient/Family education, Self Care, Joint mobilization, Dry Needling, Electrical stimulation, Spinal manipulation, Spinal mobilization, Cryotherapy, Moist heat, Traction, Ionotophoresis 4mg /ml Dexamethasone, and Manual therapy.  PLAN FOR NEXT SESSION: stretching! Can try some deep massage to piriformis. might benefit from traction    Cassie Freer, PT 06/12/2023, 1:15 PM

## 2023-06-12 ENCOUNTER — Ambulatory Visit: Payer: BC Managed Care – PPO | Attending: Family Medicine

## 2023-06-12 DIAGNOSIS — M5416 Radiculopathy, lumbar region: Secondary | ICD-10-CM | POA: Insufficient documentation

## 2023-06-12 DIAGNOSIS — G5701 Lesion of sciatic nerve, right lower limb: Secondary | ICD-10-CM | POA: Diagnosis not present

## 2023-06-19 ENCOUNTER — Ambulatory Visit: Payer: BC Managed Care – PPO | Admitting: Physical Therapy

## 2023-06-19 DIAGNOSIS — G5701 Lesion of sciatic nerve, right lower limb: Secondary | ICD-10-CM

## 2023-06-19 DIAGNOSIS — M5416 Radiculopathy, lumbar region: Secondary | ICD-10-CM | POA: Diagnosis not present

## 2023-06-19 NOTE — Therapy (Signed)
OUTPATIENT PHYSICAL THERAPY THORACOLUMBAR TREATMENT   Patient Name: Larry Brooks MRN: [id number] DOB:1976-07-13, 47 y.o., male Today's Date: 06/19/2023  END OF SESSION:  PT End of Session - 06/19/23 1701     Visit Number 2    Date for PT Re-Evaluation 08/21/23    PT Start Time 1700    PT Stop Time 1745    PT Time Calculation (min) 45 min    Activity Tolerance Patient tolerated treatment well    Behavior During Therapy [facility] for tasks assessed/performed             Past Medical History:  Diagnosis Date   Childhood asthma    Generalized anxiety disorder    Leukopenia    Thrombocytopenia (HCC)    Past Surgical History:  Procedure Laterality Date   TONSILLECTOMY     WISDOM TOOTH EXTRACTION     Patient Active Problem List   Diagnosis Date Noted   COVID-19 long hauler manifesting chronic cough 01/30/2023   Viral URI with cough 01/02/2023   Labral tear of shoulder, degenerative, right 07/17/2022   Seasonal and perennial allergic rhinitis 03/27/2022   Adverse food reaction 03/27/2022   Mild persistent asthma 03/27/2022   Recurrent sinusitis 03/27/2022   Allergies 11/09/2021   Preventative health care 07/15/2021   Cardiac risk counseling 12/16/2011   Bronchitis 09/24/2011   ALLERGIC RHINITIS 10/31/2010   GENERALIZED ANXIETY DISORDER 12/08/2008   HYPERLIPIDEMIA 12/19/2007    PCP: Sandford Craze  REFERRING PROVIDER: Clementeen Graham  REFERRING DIAG: M54.16 Lumbar Radiculopathy   Rationale for Evaluation and Treatment: Rehabilitation  THERAPY DIAG:  Radiculopathy, lumbar region  Piriformis syndrome of right side  ONSET DATE: 07/18/23  SUBJECTIVE:                                                                                                                                                                                           SUBJECTIVE STATEMENT: Feeling tired - physically, have not noticed the numbness as much near foot, but is still tingling down the R  LE.  PERTINENT HISTORY:  Today, pt c/o R leg pain ongoing since the beginning of the year, worsening mid-May. Pt locates pain to the toes of his R foot and the lateral aspect of his R lower leg. Symptoms vary and seem to be exacerbated by activity.  Pain is worse at the right lateral calf.   PAIN:  Are you having pain? Yes: NPRS scale: 2-3/10 Pain location: lateral R hip and thigh Pain description: sharp, shooting, electrical, sting, worse in the morning  Aggravating factors: I cannot tell right now  Relieving factors: moving around sometimes  PRECAUTIONS: None  RED FLAGS: None   WEIGHT BEARING RESTRICTIONS: No  FALLS:  Has patient fallen in last 6 months? 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 Alignment is normal. Intervertebral disc spaces are maintained.   IMPRESSION: Negative exam  PATIENT SURVEYS:  FOTO 72  COGNITION: Overall cognitive status: Within functional limits for tasks assessed     SENSATION: WFL  MUSCLE LENGTH: Hamstrings: very tight in BLE  POSTURE: No Significant postural limitations  PALPATION: No remarkable findings, just very tight   LUMBAR ROM:   AROM eval  Flexion 75% a little painful and tight  Extension WFL  Right lateral flexion "A little bit of tightness, can feel that in the back on leg"  Left lateral flexion "A little bit of tightness"  Right rotation WFL  Left rotation WFL   (Blank rows = not tested)  LOWER EXTREMITY ROM:   grossly WFL    LOWER EXTREMITY MMT:  5/5    LUMBAR SPECIAL TESTS:  Straight leg raise test: Positive and Slump test: Positive   TODAY'S TREATMENT:                                                                                                                               DATE:   06/19/23 NuStep L5 x 6 mins Open/close gate red t-band 2x10 Prone extension stretch with added overpressure Prone hip ext 2x15 R pirifromis deep massage with theragun Ball squeeze and bridge 2x10 Trunk rotation 2x10 SLR with R 3Lb 2x10 LE stretching/PROM     EVAL 06/12/23    PATIENT EDUCATION:  Education details: HEP, POC, piriformis syndrome and sciatica  Person educated: Patient Education method: Explanation Education comprehension: verbalized understanding  HOME EXERCISE PROGRAM: Access Code: [email or web address] URL: [email or web address] Date: 06/12/2023 Prepared by: Cassie Freer  Exercises - Supine Piriformis Stretch  - 1 x daily - 7 x weekly - 2 sets - 10 reps - Supine Sciatic Nerve Glide  - 1 x daily - 7 x weekly - 2 sets - 10 reps - Supine Hamstring Stretch with Strap  - 1 x daily - 7 x weekly - 2 reps - 30 hold - Gastroc Stretch on Wall  - 1 x daily - 7 x weekly - 1 sets - 2 reps - 30 hold - Seated Hamstring Stretch  - 1 x daily - 7 x weekly - 1 sets - 2 reps - 30 hold - Seated Piriformis Stretch with Trunk Bend  - 1 x daily - 7 x weekly - 1 sets - 2 reps - 30 hold  ASSESSMENT:  CLINICAL IMPRESSION: Patient has been doing his HEP and stated the piriformis stretch has been getting easier and he is  having a hard time getting his leg up against the wall for stretching. Pt entered with tingling down the R LE but numbness is not all the way down to foot, and during trunk extension with overpressure he stated not having any numbness or tingling and remained that way the rest of session. We continued with core and LE strengthening along with stretching and deep tissue work to relieve stiffness. Pt was educated on the importance of centralization of symptoms and overall he tolerated treatment well. He would beneift from continued PT to decrease his paina nd improve his mobility.   OBJECTIVE IMPAIRMENTS:  Abnormal gait, difficulty walking, decreased ROM, hypomobility, and pain.   REHAB POTENTIAL: Good  CLINICAL DECISION MAKING: Stable/uncomplicated  EVALUATION COMPLEXITY: Low   GOALS: Goals reviewed with patient? Yes  SHORT TERM GOALS: Target date: 07/17/23  Patient will be independent with initial HEP.  Goal status: INITIAL   LONG TERM GOALS: Target date: 08/01/23  Patient will be independent with advanced/ongoing HEP to improve outcomes and carryover.  Goal status: INITIAL  2.  Patient will decrease numbness and tingling in RLE at least 50%  Goal status: INITIAL  3.  Patient will demonstrate full pain free lumbar ROM to perform ADLs.   Baseline: see chart above Goal status: INITIAL  4.  Patient will demonstrate improved flexibility in hamstrings and piriformis Baseline: visible shaking and some pain with stretching  Goal status: INITIAL   PLAN:  PT FREQUENCY: 1x/week  PT DURATION: 10 weeks  PLANNED INTERVENTIONS: Therapeutic exercises, Therapeutic activity, Neuromuscular re-education, Balance training, Gait training, Patient/Family education, Self Care, Joint mobilization, Dry Needling, Electrical stimulation, Spinal manipulation, Spinal mobilization, Cryotherapy, Moist heat, Traction, Ionotophoresis 4mg /ml Dexamethasone, and Manual therapy.  PLAN FOR NEXT SESSION: continue with stretching, might benefit from traction    George Ina, SPTA 06/19/2023, 5:02 PM

## 2023-06-27 ENCOUNTER — Encounter (INDEPENDENT_AMBULATORY_CARE_PROVIDER_SITE_OTHER): Payer: Self-pay

## 2023-07-02 ENCOUNTER — Encounter: Payer: Self-pay | Admitting: Physical Therapy

## 2023-07-02 ENCOUNTER — Ambulatory Visit: Payer: BC Managed Care – PPO | Attending: Family Medicine | Admitting: Physical Therapy

## 2023-07-02 DIAGNOSIS — M5416 Radiculopathy, lumbar region: Secondary | ICD-10-CM | POA: Diagnosis not present

## 2023-07-02 DIAGNOSIS — G5701 Lesion of sciatic nerve, right lower limb: Secondary | ICD-10-CM | POA: Diagnosis not present

## 2023-07-02 NOTE — Therapy (Signed)
OUTPATIENT PHYSICAL THERAPY THORACOLUMBAR TREATMENT   Patient Name: Larry Brooks MRN: [id number] DOB:1976-01-13, 47 y.o., male Today's Date: 07/02/2023  END OF SESSION:  PT End of Session - 07/02/23 1235     Visit Number 3    Date for PT Re-Evaluation 08/21/23    PT Start Time 1231    PT Stop Time 1310    PT Time Calculation (min) 39 min    Activity Tolerance Patient tolerated treatment well    Behavior During Therapy [facility] for tasks assessed/performed              Past Medical History:  Diagnosis Date   Childhood asthma    Generalized anxiety disorder    Leukopenia    Thrombocytopenia (HCC)    Past Surgical History:  Procedure Laterality Date   TONSILLECTOMY     WISDOM TOOTH EXTRACTION     Patient Active Problem List   Diagnosis Date Noted   COVID-19 long hauler manifesting chronic cough 01/30/2023   Viral URI with cough 01/02/2023   Labral tear of shoulder, degenerative, right 07/17/2022   Seasonal and perennial allergic rhinitis 03/27/2022   Adverse food reaction 03/27/2022   Mild persistent asthma 03/27/2022   Recurrent sinusitis 03/27/2022   Allergies 11/09/2021   Preventative health care 07/15/2021   Cardiac risk counseling 12/16/2011   Bronchitis 09/24/2011   ALLERGIC RHINITIS 10/31/2010   GENERALIZED ANXIETY DISORDER 12/08/2008   HYPERLIPIDEMIA 12/19/2007    PCP: Sandford Craze  REFERRING PROVIDER: Clementeen Graham  REFERRING DIAG: M54.16 Lumbar Radiculopathy   Rationale for Evaluation and Treatment: Rehabilitation  THERAPY DIAG:  Radiculopathy, lumbar region  Piriformis syndrome of right side  ONSET DATE: 07/18/23  SUBJECTIVE:                                                                                                                                                                                           SUBJECTIVE STATEMENT: Patient reports he is stretching daily. His symptoms appear to be improved, but he still occasionally feels  pain down into his toes.  PERTINENT HISTORY:  Today, pt c/o R leg pain ongoing since the beginning of the year, worsening mid-May. Pt locates pain to the toes of his R foot and the lateral aspect of his R lower leg. Symptoms vary and seem to be exacerbated by activity.  Pain is worse at the right lateral calf.  PAIN:  Are you having pain? Yes: NPRS scale: 2-3/10 Pain location: lateral R hip and thigh Pain description: sharp, shooting, electrical, sting, worse in the morning  Aggravating factors: I cannot tell right now  Relieving factors: moving around sometimes  PRECAUTIONS:  None  RED FLAGS: None   WEIGHT BEARING RESTRICTIONS: No  FALLS:  Has patient fallen in last 6 months? 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 Alignment is normal. Intervertebral disc spaces are maintained.   IMPRESSION: Negative exam  PATIENT SURVEYS:  FOTO 72  COGNITION: Overall cognitive status: Within functional limits for tasks assessed     SENSATION: WFL  MUSCLE LENGTH: Hamstrings: very tight in BLE  POSTURE: No Significant postural limitations  PALPATION: No remarkable findings, just very tight   LUMBAR ROM:   AROM eval  Flexion 75% a little painful and tight  Extension WFL  Right lateral flexion "A little bit of tightness, can feel that in the back on leg"  Left lateral flexion "A little bit of tightness"  Right rotation WFL  Left rotation WFL   (Blank rows = not tested)  LOWER EXTREMITY ROM:   grossly WFL    LOWER EXTREMITY MMT:  5/5    LUMBAR SPECIAL TESTS:  Straight leg raise test: Positive and Slump test: Positive   TODAY'S TREATMENT:                                                                                                                               DATE:  07/02/23 NuStep L5 x 6 minutes. Soft tissue assessment in R hip, mild tightness in piriformis, HS tight, no TTP LE stretch for piriformis, HS, hip musculature with strap. 2 x 15 sec each RLE.  Seated clamshells against G tband resistance, 2 x 10 B side stepping against G Tband resistance, 3 x 5 steps each way. Mini squats, 2 x 10 reps  06/19/23 NuStep L5 x 6 mins Open/close gate red t-band 2x10 Prone extension stretch with added overpressure Prone hip ext 2x15 R pirifromis deep massage with theragun Ball squeeze and bridge 2x10 Trunk rotation 2x10 SLR with R 3Lb 2x10 LE stretching/PROM  EVAL 06/12/23   PATIENT EDUCATION:  Education details: HEP, POC, piriformis syndrome and sciatica  Person educated: Patient Education method: Explanation Education comprehension: verbalized understanding  HOME EXERCISE PROGRAM: Access Code: [email or web address] URL: [email or web address] Date: 06/12/2023 Prepared by: Cassie Freer  Exercises - Supine Piriformis Stretch  - 1 x daily - 7 x weekly - 2 sets - 10 reps - Supine Sciatic Nerve Glide  - 1 x daily - 7 x weekly - 2 sets - 10 reps - Supine Hamstring Stretch with Strap  - 1 x daily - 7 x weekly - 2 reps - 30 hold - Gastroc Stretch on Wall  - 1 x daily - 7 x weekly - 1 sets - 2 reps - 30 hold - Seated Hamstring Stretch  - 1 x  daily - 7 x weekly - 1 sets - 2 reps - 30 hold - Seated Piriformis Stretch with Trunk Bend  - 1 x daily - 7 x weekly - 1 sets - 2 reps - 30 hold  ASSESSMENT:  CLINICAL IMPRESSION: Patient reports continued improvement in his symptoms, with decreased frequency. Continued with STM and stretch to hips. Updated HEP with strengthening activities for hips, tolerated all activities without pain. Educated patient on how to perform some of his stretching while sitting at his desk at work as he reports some difficulty finding the time to perform  HEP.   OBJECTIVE IMPAIRMENTS: Abnormal gait, difficulty walking, decreased ROM, hypomobility, and pain.   REHAB POTENTIAL: Good  CLINICAL DECISION MAKING: Stable/uncomplicated  EVALUATION COMPLEXITY: Low   GOALS: Goals reviewed with patient? Yes  SHORT TERM GOALS: Target date: 07/17/23  Patient will be independent with initial HEP.  Goal status: 07/02/23- Met   LONG TERM GOALS: Target date: 08/01/23  Patient will be independent with advanced/ongoing HEP to improve outcomes and carryover.  Goal status: INITIAL  2.  Patient will decrease numbness and tingling in RLE at least 50%  Goal status: INITIAL  3.  Patient will demonstrate full pain free lumbar ROM to perform ADLs.   Baseline: see chart above Goal status: INITIAL  4.  Patient will demonstrate improved flexibility in hamstrings and piriformis Baseline: visible shaking and some pain with stretching  Goal status: INITIAL   PLAN:  PT FREQUENCY: 1x/week  PT DURATION: 10 weeks  PLANNED INTERVENTIONS: Therapeutic exercises, Therapeutic activity, Neuromuscular re-education, Balance training, Gait training, Patient/Family education, Self Care, Joint mobilization, Dry Needling, Electrical stimulation, Spinal manipulation, Spinal mobilization, Cryotherapy, Moist heat, Traction, Ionotophoresis 4mg /ml Dexamethasone, and Manual therapy.  PLAN FOR NEXT SESSION: continue with stretching, might benefit from traction    Iona Beard, DPT 07/02/2023, 1:23 PM

## 2023-07-09 ENCOUNTER — Ambulatory Visit: Payer: BC Managed Care – PPO | Admitting: Physical Therapy

## 2023-07-09 ENCOUNTER — Encounter: Payer: Self-pay | Admitting: Physical Therapy

## 2023-07-09 DIAGNOSIS — M5416 Radiculopathy, lumbar region: Secondary | ICD-10-CM

## 2023-07-09 DIAGNOSIS — G5701 Lesion of sciatic nerve, right lower limb: Secondary | ICD-10-CM

## 2023-07-09 NOTE — Therapy (Signed)
OUTPATIENT PHYSICAL THERAPY THORACOLUMBAR TREATMENT   Patient Name: Larry Brooks MRN: [id number] DOB:1976-02-16, 47 y.o., male Today's Date: 07/09/2023  END OF SESSION:  PT End of Session - 07/09/23 1233     Visit Number 4    Date for PT Re-Evaluation 08/21/23    PT Start Time 1230    PT Stop Time 1310    PT Time Calculation (min) 40 min    Activity Tolerance Patient tolerated treatment well    Behavior During Therapy [facility] for tasks assessed/performed               Past Medical History:  Diagnosis Date   Childhood asthma    Generalized anxiety disorder    Leukopenia    Thrombocytopenia (HCC)    Past Surgical History:  Procedure Laterality Date   TONSILLECTOMY     WISDOM TOOTH EXTRACTION     Patient Active Problem List   Diagnosis Date Noted   COVID-19 long hauler manifesting chronic cough 01/30/2023   Viral URI with cough 01/02/2023   Labral tear of shoulder, degenerative, right 07/17/2022   Seasonal and perennial allergic rhinitis 03/27/2022   Adverse food reaction 03/27/2022   Mild persistent asthma 03/27/2022   Recurrent sinusitis 03/27/2022   Allergies 11/09/2021   Preventative health care 07/15/2021   Cardiac risk counseling 12/16/2011   Bronchitis 09/24/2011   ALLERGIC RHINITIS 10/31/2010   GENERALIZED ANXIETY DISORDER 12/08/2008   HYPERLIPIDEMIA 12/19/2007    PCP: Sandford Craze  REFERRING PROVIDER: Clementeen Graham  REFERRING DIAG: M54.16 Lumbar Radiculopathy   Rationale for Evaluation and Treatment: Rehabilitation  THERAPY DIAG:  Piriformis syndrome of right side  Radiculopathy, lumbar region  ONSET DATE: 07/18/23  SUBJECTIVE:                                                                                                                                                                                           SUBJECTIVE STATEMENT: Patient reports he is stretching daily. His symptoms appear to be improved, but he still occasionally  feels pain down into his toes.  PERTINENT HISTORY:  Today, pt c/o R leg pain ongoing since the beginning of the year, worsening mid-May. Pt locates pain to the toes of his R foot and the lateral aspect of his R lower leg. Symptoms vary and seem to be exacerbated by activity.  Pain is worse at the right lateral calf.  PAIN:  Are you having pain? Yes: NPRS scale: 2-3/10 Pain location: lateral R hip and thigh Pain description: sharp, shooting, electrical, sting, worse in the morning  Aggravating factors: I cannot tell right now  Relieving factors: moving around sometimes  PRECAUTIONS: None  RED FLAGS: None   WEIGHT BEARING RESTRICTIONS: No  FALLS:  Has patient fallen in last 6 months? 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 Alignment is normal. Intervertebral disc spaces are maintained.   IMPRESSION: Negative exam  PATIENT SURVEYS:  FOTO 72  COGNITION: Overall cognitive status: Within functional limits for tasks assessed     SENSATION: WFL  MUSCLE LENGTH: Hamstrings: very tight in BLE  POSTURE: No Significant postural limitations  PALPATION: No remarkable findings, just very tight   LUMBAR ROM:   AROM eval  Flexion 75% a little painful and tight  Extension WFL  Right lateral flexion "A little bit of tightness, can feel that in the back on leg"  Left lateral flexion "A little bit of tightness"  Right rotation WFL  Left rotation WFL   (Blank rows = not tested)  LOWER EXTREMITY ROM:   grossly WFL    LOWER EXTREMITY MMT:  5/5    LUMBAR SPECIAL TESTS:  Straight leg raise test: Positive and Slump test: Positive   TODAY'S TREATMENT:                                                                                                                               DATE:  07/09/23 NuStep L5 x 6 minutes Massage gun to R piriformis Supine 3 way hip stretch with strap Seated HS stretch Standing add stretch Sit to stand while standing on Airex pad with G tband at knees, OHP with 4# ball at the top of the stand x 10, repeat with chest press x 10 BOSU ball, upside down, static standing, lat weight shifts, ant/post weight shifts, then circles in each direction- impressive control. Alternating stp ups onto 6" box on top of Airex, 5 reps each leg.  07/02/23 NuStep L5 x 6 minutes. Soft tissue assessment in R hip, mild tightness in piriformis, HS tight, no TTP LE stretch for piriformis, HS, hip musculature with strap. 2 x 15 sec each RLE.  Seated clamshells against G tband resistance, 2 x 10 B side stepping against G Tband resistance, 3 x 5 steps each way. Mini squats, 2 x 10 reps  06/19/23 NuStep L5 x 6 mins Open/close gate red t-band 2x10 Prone extension stretch with added overpressure Prone hip ext 2x15 R pirifromis deep massage with theragun Ball squeeze and bridge 2x10 Trunk rotation 2x10 SLR with R 3Lb 2x10 LE stretching/PROM  EVAL 06/12/23   PATIENT EDUCATION:  Education details: HEP, POC, piriformis syndrome and sciatica  Person educated: Patient Education method: Explanation Education comprehension: verbalized understanding  HOME EXERCISE PROGRAM: Access Code: [email or web address] URL: [email or web address] Date: 06/12/2023 Prepared by: Cassie Freer  Exercises -  Supine Piriformis Stretch  - 1 x daily - 7 x weekly - 2 sets - 10 reps - Supine Sciatic Nerve Glide  - 1 x daily - 7 x weekly - 2 sets - 10 reps - Supine Hamstring Stretch with Strap  - 1 x daily - 7 x weekly - 2 reps - 30 hold - Gastroc Stretch on Wall  - 1 x daily - 7 x weekly - 1 sets - 2 reps - 30 hold - Seated Hamstring Stretch  - 1 x daily - 7 x weekly - 1 sets - 2 reps - 30 hold - Seated  Piriformis Stretch with Trunk Bend  - 1 x daily - 7 x weekly - 1 sets - 2 reps - 30 hold  ASSESSMENT:  CLINICAL IMPRESSION: Patient reports continued improvement in his symptoms, with decreased frequency. Practiced some additional stretching as he is very tight throughout his body. Introduced some stability activities on soft  surface to engage all LE muscles.    OBJECTIVE IMPAIRMENTS: Abnormal gait, difficulty walking, decreased ROM, hypomobility, and pain.   REHAB POTENTIAL: Good  CLINICAL DECISION MAKING: Stable/uncomplicated  EVALUATION COMPLEXITY: Low   GOALS: Goals reviewed with patient? Yes  SHORT TERM GOALS: Target date: 07/17/23  Patient will be independent with initial HEP.  Goal status: 07/02/23- Met   LONG TERM GOALS: Target date: 08/01/23  Patient will be independent with advanced/ongoing HEP to improve outcomes and carryover.  Goal status: INITIAL  2.  Patient will decrease numbness and tingling in RLE at least 50%  Goal status: 07/09/23- at least 25% improved, ongoing  3.  Patient will demonstrate full pain free lumbar ROM to perform ADLs.   Baseline: see chart above Goal status: INITIAL  4.  Patient will demonstrate improved flexibility in hamstrings and piriformis Baseline: visible shaking and some pain with stretching  Goal status: 07/09/23-HS remain severely tight. ongoing   PLAN:  PT FREQUENCY: 1x/week  PT DURATION: 10 weeks  PLANNED INTERVENTIONS: Therapeutic exercises, Therapeutic activity, Neuromuscular re-education, Balance training, Gait training, Patient/Family education, Self Care, Joint mobilization, Dry Needling, Electrical stimulation, Spinal manipulation, Spinal mobilization, Cryotherapy, Moist heat, Traction, Ionotophoresis 4mg /ml Dexamethasone, and Manual therapy.  PLAN FOR NEXT SESSION: continue with stretching, might benefit from traction    Iona Beard, DPT 07/09/2023, 1:16 PM

## 2023-07-11 ENCOUNTER — Ambulatory Visit: Payer: BC Managed Care – PPO | Admitting: Sports Medicine

## 2023-07-16 ENCOUNTER — Encounter: Payer: Self-pay | Admitting: Physical Therapy

## 2023-07-16 ENCOUNTER — Ambulatory Visit: Payer: BC Managed Care – PPO | Admitting: Physical Therapy

## 2023-07-16 DIAGNOSIS — G5701 Lesion of sciatic nerve, right lower limb: Secondary | ICD-10-CM | POA: Diagnosis not present

## 2023-07-16 DIAGNOSIS — M5416 Radiculopathy, lumbar region: Secondary | ICD-10-CM

## 2023-07-16 NOTE — Therapy (Signed)
OUTPATIENT PHYSICAL THERAPY THORACOLUMBAR TREATMENT   Patient Name: Larry Brooks MRN: [id number] DOB:1976-10-01, 47 y.o., male Today's Date: 07/16/2023  END OF SESSION:  PT End of Session - 07/16/23 1231     Visit Number 5    Date for PT Re-Evaluation 08/21/23    PT Start Time 1228    PT Stop Time 1310    PT Time Calculation (min) 42 min    Activity Tolerance Patient tolerated treatment well    Behavior During Therapy [facility] for tasks assessed/performed                Past Medical History:  Diagnosis Date   Childhood asthma    Generalized anxiety disorder    Leukopenia    Thrombocytopenia (HCC)    Past Surgical History:  Procedure Laterality Date   TONSILLECTOMY     WISDOM TOOTH EXTRACTION     Patient Active Problem List   Diagnosis Date Noted   COVID-19 long hauler manifesting chronic cough 01/30/2023   Viral URI with cough 01/02/2023   Labral tear of shoulder, degenerative, right 07/17/2022   Seasonal and perennial allergic rhinitis 03/27/2022   Adverse food reaction 03/27/2022   Mild persistent asthma 03/27/2022   Recurrent sinusitis 03/27/2022   Allergies 11/09/2021   Preventative health care 07/15/2021   Cardiac risk counseling 12/16/2011   Bronchitis 09/24/2011   ALLERGIC RHINITIS 10/31/2010   GENERALIZED ANXIETY DISORDER 12/08/2008   HYPERLIPIDEMIA 12/19/2007    PCP: Sandford Craze  REFERRING PROVIDER: Clementeen Graham  REFERRING DIAG: M54.16 Lumbar Radiculopathy   Rationale for Evaluation and Treatment: Rehabilitation  THERAPY DIAG:  Piriformis syndrome of right side  Radiculopathy, lumbar region  ONSET DATE: 07/18/23  SUBJECTIVE:                                                                                                                                                                                           SUBJECTIVE STATEMENT: Patient reports he is stiff in his hs today. Hip feels better. He was very active this weekend,  performing some activities he is not used to doing.  PERTINENT HISTORY:  Today, pt c/o R leg pain ongoing since the beginning of the year, worsening mid-May. Pt locates pain to the toes of his R foot and the lateral aspect of his R lower leg. Symptoms vary and seem to be exacerbated by activity.  Pain is worse at the right lateral calf.  PAIN:  Are you having pain? Yes: NPRS scale: 2-3/10 Pain location: lateral R hip and thigh Pain description: sharp, shooting, electrical, sting, worse in the morning  Aggravating factors: I cannot tell right now  Relieving factors: moving around sometimes  PRECAUTIONS: None  RED FLAGS: None   WEIGHT BEARING RESTRICTIONS: No  FALLS:  Has patient fallen in last 6 months? 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 Alignment is normal. Intervertebral disc spaces are maintained.   IMPRESSION: Negative exam  PATIENT SURVEYS:  FOTO 72  COGNITION: Overall cognitive status: Within functional limits for tasks assessed     SENSATION: WFL  MUSCLE LENGTH: Hamstrings: very tight in BLE  POSTURE: No Significant postural limitations  PALPATION: No remarkable findings, just very tight   LUMBAR ROM:   AROM eval  Flexion 75% a little painful and tight  Extension WFL  Right lateral flexion "A little bit of tightness, can feel that in the back on leg"  Left lateral flexion "A little bit of tightness"  Right rotation WFL  Left rotation WFL   (Blank rows = not tested)  LOWER EXTREMITY ROM:   grossly WFL   LOWER EXTREMITY MMT:  5/5   LUMBAR SPECIAL TESTS:  Straight leg raise test: Positive and Slump test: Positive  TODAY'S TREATMENT:                                                                                                                               DATE:  07/16/23 NuStep L5 x 6 minutes Cobra pose, push up as high as possible while still keeping hips in contact with the mat, 2 x 30 sec. Child's pose, very tight Cat/Cow with limited motion noted. Quadruped alternating arm reach, leg reach, bird dog x 4 each side. Quadruped thread the needle x 3 each direction. Seated hs stretch with alternating leg on mat. Seated piriformis stretch with leg on mat, 3 x 15 sec on each side.  07/09/23 NuStep L5 x 6 minutes Massage gun to R piriformis Supine 3 way hip stretch with strap Seated HS stretch Standing add stretch Sit to stand while standing on Airex pad with G tband at knees, OHP with 4# ball at the top of the stand x 10, repeat with chest press x 10 BOSU ball, upside down, static standing, lat weight shifts, ant/post weight shifts, then circles in each direction- impressive control. Alternating stp ups onto 6" box on top of Airex, 5 reps each leg.  07/02/23 NuStep L5 x 6 minutes. Soft tissue assessment in R hip, mild tightness in piriformis, HS tight, no TTP LE stretch for piriformis, HS, hip musculature with strap. 2 x 15 sec each RLE.  Seated clamshells against G tband resistance, 2 x 10 B side stepping against G Tband resistance, 3 x 5 steps each way. Mini squats, 2 x 10 reps  06/19/23 NuStep L5 x 6  mins Open/close gate red t-band 2x10 Prone extension stretch with added overpressure Prone hip ext 2x15 R pirifromis deep massage with theragun Ball squeeze and bridge 2x10 Trunk rotation 2x10 SLR with R 3Lb 2x10 LE stretching/PROM  EVAL 06/12/23   PATIENT EDUCATION:  Education details: HEP, POC, piriformis syndrome and sciatica  Person educated: Patient Education method: Explanation Education comprehension: verbalized understanding  HOME EXERCISE PROGRAM: Access Code: [email or web address] URL: [email or web address] Date:  06/12/2023 Prepared by: Cassie Freer  Exercises - Supine Piriformis Stretch  - 1 x daily - 7 x weekly - 2 sets - 10 reps - Supine Sciatic Nerve Glide  - 1 x daily - 7 x weekly - 2 sets - 10 reps - Supine Hamstring Stretch with Strap  - 1 x daily - 7 x weekly - 2 reps - 30 hold - Gastroc Stretch on Wall  - 1 x daily - 7 x weekly - 1 sets - 2 reps - 30 hold - Seated Hamstring Stretch  - 1 x daily - 7 x weekly - 1 sets - 2 reps - 30 hold - Seated Piriformis Stretch with Trunk Bend  - 1 x daily - 7 x weekly - 1 sets - 2 reps - 30 hold  ASSESSMENT:  CLINICAL IMPRESSION: Patient reports continued improvement in his symptoms, with decreased frequency. Progressed his stretching, engaging deep breathing to facilitate relaxing into the stretch. He is very tense and tight. Also added some trunk stability exercises. Plan to add some power and lateral stability exercises as he would like to play tennis.    OBJECTIVE IMPAIRMENTS: Abnormal gait, difficulty walking, decreased ROM, hypomobility, and pain.   REHAB POTENTIAL: Good  CLINICAL DECISION MAKING: Stable/uncomplicated  EVALUATION COMPLEXITY: Low   GOALS: Goals reviewed with patient? Yes  SHORT TERM GOALS: Target date: 07/17/23  Patient will be independent with initial HEP.  Goal status: 07/02/23- Met   LONG TERM GOALS: Target date: 08/01/23  Patient will be independent with advanced/ongoing HEP to improve outcomes and carryover.  Goal status: INITIAL  2.  Patient will decrease numbness and tingling in RLE at least 50%  Goal status: 07/09/23- at least 25% improved, ongoing  3.  Patient will demonstrate full pain free lumbar ROM to perform ADLs.   Baseline: see chart above Goal status: INITIAL  4.  Patient will demonstrate improved flexibility in hamstrings and piriformis Baseline: visible shaking and some pain with stretching  Goal status: 07/09/23-HS remain severely tight. ongoing   PLAN:  PT FREQUENCY: 1x/week  PT DURATION: 10  weeks  PLANNED INTERVENTIONS: Therapeutic exercises, Therapeutic activity, Neuromuscular re-education, Balance training, Gait training, Patient/Family education, Self Care, Joint mobilization, Dry Needling, Electrical stimulation, Spinal manipulation, Spinal mobilization, Cryotherapy, Moist heat, Traction, Ionotophoresis 4mg /ml Dexamethasone, and Manual therapy.  PLAN FOR NEXT SESSION: continue with stretching, might benefit from traction    Iona Beard, DPT 07/16/2023, 1:14 PM

## 2023-07-18 ENCOUNTER — Encounter: Payer: Self-pay | Admitting: Family Medicine

## 2023-07-18 ENCOUNTER — Ambulatory Visit (INDEPENDENT_AMBULATORY_CARE_PROVIDER_SITE_OTHER): Payer: BC Managed Care – PPO | Admitting: Family Medicine

## 2023-07-18 ENCOUNTER — Ambulatory Visit: Payer: BC Managed Care – PPO | Admitting: Sports Medicine

## 2023-07-18 VITALS — BP 126/86 | HR 94 | Ht 71.0 in | Wt 194.0 lb

## 2023-07-18 DIAGNOSIS — M5416 Radiculopathy, lumbar region: Secondary | ICD-10-CM

## 2023-07-18 NOTE — Patient Instructions (Signed)
Thank you for coming in today.   Continue home exercises.   We could do a MRI in the future if we need to.   Keep me updated.   Recheck ad needed.

## 2023-07-18 NOTE — Progress Notes (Signed)
    I, Stevenson Clinch, CMA acting as a scribe for Larry Graham, MD.  Betzalel Dohner is a 47 y.o. male who presents to [facility] at [facility] today for continued right leg pain. Continues with PT once weekly, working on strengthening exercises. Gets sharp shooting pains in the mornings when first waking up, radiating into the foot. Sx improve after doing stretches. Denies back pain. Notes improvement of soreness at lateral right hip. Notes that n/t has resolved.    Pertinent review of systems: No fevers or chills  Relevant historical information: Asthma   Exam:  BP 126/86   Pulse 94   Ht 5\' 11"  (1.803 m)   Wt 194 lb (88 kg)   SpO2 97%   BMI 27.06 kg/m  General: Well Developed, well nourished, and in no acute distress.   MSK: L-spine nontender palpation.  Normal lumbar motion.  Lower extremity strength is intact.  Reflexes are intact.      Assessment and Plan: 47 y.o. male with resolving lumbar radiculopathy.  He is improved considerably with physical therapy and has 1 more PT session scheduled.  Continue and finish out PT and continue home exercise program.  Watchful waiting.  Certainly we could do more including proceeding with an MRI.  His symptoms right now are not bad enough to do that.  Check back as needed.  Precautions reviewed.   PDMP not reviewed this encounter. No orders of the defined types were placed in this encounter.  No orders of the defined types were placed in this encounter.    Discussed warning signs or symptoms. Please see discharge instructions. Patient expresses understanding.   The above documentation has been reviewed and is accurate and complete Larry Brooks, M.D.

## 2023-07-20 ENCOUNTER — Ambulatory Visit (INDEPENDENT_AMBULATORY_CARE_PROVIDER_SITE_OTHER): Payer: BC Managed Care – PPO | Admitting: Family

## 2023-07-20 ENCOUNTER — Encounter: Payer: Self-pay | Admitting: Family

## 2023-07-20 VITALS — BP 124/76 | HR 78 | Temp 98.0°F | Resp 18 | Ht 71.0 in | Wt 198.0 lb

## 2023-07-20 DIAGNOSIS — Z0000 Encounter for general adult medical examination without abnormal findings: Secondary | ICD-10-CM

## 2023-07-20 DIAGNOSIS — D696 Thrombocytopenia, unspecified: Secondary | ICD-10-CM | POA: Diagnosis not present

## 2023-07-20 LAB — TSH: TSH: 2.28 u[IU]/mL (ref 0.35–5.50)

## 2023-07-20 LAB — COMPREHENSIVE METABOLIC PANEL
ALT: 69 U/L — ABNORMAL HIGH (ref 0–53)
AST: 35 U/L (ref 0–37)
Albumin: 4.4 g/dL (ref 3.5–5.2)
Alkaline Phosphatase: 86 U/L (ref 39–117)
BUN: 18 mg/dL (ref 6–23)
CO2: 29 mEq/L (ref 19–32)
Calcium: 9.2 mg/dL (ref 8.4–10.5)
Chloride: 104 mEq/L (ref 96–112)
Creatinine, Ser: 1.29 mg/dL (ref 0.40–1.50)
GFR: 66.32 mL/min (ref >60.00)
Glucose, Bld: 87 mg/dL (ref 70–99)
Potassium: 4.2 mEq/L (ref 3.5–5.1)
Sodium: 139 mEq/L (ref 135–145)
Total Bilirubin: 1.6 mg/dL — ABNORMAL HIGH (ref 0.2–1.2)
Total Protein: 6.7 g/dL (ref 6.0–8.3)

## 2023-07-20 LAB — CBC WITH DIFFERENTIAL/PLATELET
Basophils Absolute: 0 10*3/uL (ref 0.0–0.1)
Basophils Relative: 0.7 % (ref 0.0–3.0)
Eosinophils Absolute: 0.1 10*3/uL (ref 0.0–0.7)
Eosinophils Relative: 2.7 % (ref 0.0–5.0)
HCT: 45.5 % (ref 39.0–52.0)
Hemoglobin: 15.7 g/dL (ref 13.0–17.0)
Lymphocytes Relative: 23.3 % (ref 12.0–46.0)
Lymphs Abs: 1 10*3/uL (ref 0.7–4.0)
MCHC: 34.5 g/dL (ref 30.0–36.0)
MCV: 90.2 fl (ref 78.0–100.0)
Monocytes Absolute: 0.4 10*3/uL (ref 0.1–1.0)
Monocytes Relative: 9.5 % (ref 3.0–12.0)
Neutro Abs: 2.9 10*3/uL (ref 1.4–7.7)
Neutrophils Relative %: 63.8 % (ref 43.0–77.0)
Platelets: 150 10*3/uL (ref 150.0–400.0)
RBC: 5.05 Mil/uL (ref 4.22–5.81)
RDW: 13.4 % (ref 11.5–15.5)
WBC: 4.5 10*3/uL (ref 4.0–10.5)

## 2023-07-20 LAB — LIPID PANEL
Cholesterol: 154 mg/dL (ref 0–200)
HDL: 30.7 mg/dL — ABNORMAL LOW (ref >39.00)
LDL Cholesterol: 106 mg/dL — ABNORMAL HIGH (ref 0–99)
NonHDL: 122.97
Total CHOL/HDL Ratio: 5
Triglycerides: 84 mg/dL (ref 0.0–149.0)
VLDL: 16.8 mg/dL (ref 0.0–40.0)

## 2023-07-20 NOTE — Progress Notes (Signed)
Subjective:     Patient ID: Larry Brooks, male    DOB: 1976/05/11, 47 y.o.   MRN: [id number]  Chief Complaint  Patient presents with   Annual Exam    HPI  Discussed the use of AI scribe software for clinical note transcription with the patient, who gave verbal consent to proceed.  History of Present Illness   The patient, with a history of thrombocytopenia, asthma and environmental allergies, presents for a routine check-up and expresses concern about recent weight gain. He reports reaching his highest weight of 200 pounds approximately six weeks ago. Over the past three to four weeks, he has made dietary changes, including portion control and reducing snack intake, resulting in a weight loss of six pounds. His goal is to reach a weight of 175-180 pounds.  In addition to dietary changes, the patient has increased his physical activity, walking three to four times a week for approximately 30-40 minutes each time. He also performs calisthenics, including sit-ups and push-ups, to strengthen his core. This increase in physical activity has been encouraged by his physical therapist and has resulted in improved mobility.  The patient also reports a recent issue with his right leg, which he is receiving physical therapy for. He has been diagnosed with piriformis syndrome and is managing the condition with stretches and exercises recommended by his physical therapist.  The patient has no concerns about depression or anxiety. He reports no changes to his family medical history. He has a history of thrombocytopenia, but no recent issues have been reported.  Wt Readings from Last 3 Encounters:  07/20/23 198 lb (89.8 kg)  07/18/23 194 lb (88 kg)  05/30/23 196 lb (88.9 kg)   He continues to follow with asthma/allergy for his allergies and asthma management.  Immunizations: Tetanus up to date Diet: cutting back, trying to get to 175-180 Exercise: walks 3-4 times a week 30-40 min Colonoscopy:  cologuard 2022 vision:  up to date Dental:  up to date   Health Maintenance Due  Topic Date Due   COVID-19 Vaccine (3 - 2023-24 season) 07/28/2022   INFLUENZA VACCINE  06/28/2023    Past Medical History:  Diagnosis Date   Childhood asthma    Generalized anxiety disorder    Leukopenia    Thrombocytopenia (HCC)     Past Surgical History:  Procedure Laterality Date   TONSILLECTOMY     WISDOM TOOTH EXTRACTION      Family History  Problem Relation Age of Onset   Coronary artery disease Father        status post 4 stents    Cancer Father        prostate   Hyperlipidemia Father    COPD Maternal Grandmother    Cancer Maternal Grandfather        unsure type of cancer   Cancer Paternal Grandmother        some male cancer?   Heart attack Paternal Grandfather        age 56   Hyperlipidemia Paternal Grandfather    Allergic rhinitis Son    Food Allergy Son        dairy, peanut   Food Allergy Son        peanut   Allergic rhinitis Son    Diabetes type II Other        grandfather   Hyperlipidemia Other        grandfather   Colon cancer Neg Hx    Asthma Neg Hx  Immunodeficiency Neg Hx    Angioedema Neg Hx    Atopy Neg Hx    Eczema Neg Hx    Urticaria Neg Hx     Social History   Socioeconomic History   Marital status: Married    Spouse name: Not on file   Number of children: Not on file   Years of education: Not on file   Highest education level: Master's degree (e.g., MA, MS, MEng, MEd, MSW, MBA)  Occupational History   Not on file  Tobacco Use   Smoking status: Never    Passive exposure: Never   Smokeless tobacco: Never   Tobacco comments:    never used tobacco  Vaping Use   Vaping status: Never Used  Substance and Sexual Activity   Alcohol use: Yes    Comment: very rare-social   Drug use: No   Sexual activity: Yes    Partners: Female  Other Topics Concern   Not on file  Social History Narrative   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    Married  2 sons, 2005 and 2012   Enjoys cooking, playing with his kids, reading, fishing   Social Determinants of Health   Financial Resource Strain: Low Risk  (04/03/2023)   Overall Financial Resource Strain (CARDIA)    Difficulty of Paying Living Expenses: Not hard at all  Food Insecurity: No Food Insecurity (04/03/2023)   Hunger Vital Sign    Worried About Running Out of Food in the Last Year: Never true    Ran Out of Food in the Last Year: Never true  Transportation Needs: No Transportation Needs (04/03/2023)   PRAPARE - Administrator, Civil Service (Medical): No    Lack of Transportation (Non-Medical): No  Physical Activity: Insufficiently Active (04/03/2023)   Exercise Vital Sign    Days of Exercise per Week: 2 days    Minutes of Exercise per Session: 30 min  Stress: No Stress Concern Present (04/03/2023)   [employer] of Occupational Health - Occupational Stress Questionnaire    Feeling of Stress : Not at all  Social Connections: Socially Integrated (04/03/2023)   Social Connection and Isolation Panel [NHANES]    Frequency of Communication with Friends and Family: More than three times a week    Frequency of Social Gatherings with Friends and Family: Twice a week    Attends Religious Services: More than 4 times per year    Active Member of [employer] or Organizations: Yes    Attends Engineer, structural: More than 4 times per year    Marital Status: Married  Catering manager Violence: Not on file    Outpatient Medications Prior to Visit  Medication Sig Dispense Refill   albuterol (VENTOLIN HFA) 108 (90 Base) MCG/ACT inhaler Inhale 2 puffs into the lungs every 6 (six) hours as needed for wheezing or shortness of breath. 18 g 1   Budesonide (PULMICORT FLEXHALER) 90 MCG/ACT inhaler Inhale 2 puffs into the lungs 2 (two) times daily. Start at onset of respiratory illness and continue for 2 weeks or until symptoms resolve. 1 each 3   cetirizine (ZYRTEC) 10 MG tablet  Take 10 mg by mouth daily.     ipratropium (ATROVENT) 0.03 % nasal spray Place 2 sprays into both nostrils 3 (three) times daily as needed for rhinitis. 30 mL 5   Probiotic Product (PROBIOTIC ADVANCED PO) Take 3 capsules by mouth daily.     No facility-administered medications prior to visit.  No Known Allergies  Review of Systems  Constitutional:  Positive for weight loss.  HENT:  Negative for congestion and hearing loss.   Respiratory:  Negative for cough.   Cardiovascular:  Negative for leg swelling.  Gastrointestinal:  Negative for constipation and diarrhea.  Genitourinary:  Negative for dysuria and frequency.  Musculoskeletal:  Negative for joint pain and myalgias.       Treating lumbar radiculopathy  Skin:  Negative for rash.  Neurological:  Negative for headaches (denies depression/anxiety).       Objective:    Physical Exam   BP 124/76   Pulse 78   Temp 98 F (36.7 C)   Resp 18   Ht 5\' 11"  (1.803 m)   Wt 198 lb (89.8 kg)   SpO2 100%   BMI 27.62 kg/m  Wt Readings from Last 3 Encounters:  07/20/23 198 lb (89.8 kg)  07/18/23 194 lb (88 kg)  05/30/23 196 lb (88.9 kg)   Physical Exam  Constitutional: He is oriented to person, place, and time. He appears well-developed and well-nourished. No distress.  HENT:  Head: Normocephalic and atraumatic.  Right Ear: Tympanic membrane and ear canal normal.  Left Ear: Tympanic membrane and ear canal normal.  Mouth/Throat: Oropharynx is clear and moist.  Eyes: Pupils are equal, round, and reactive to light. No scleral icterus.  Neck: Normal range of motion. No thyromegaly present.  Cardiovascular: Normal rate and regular rhythm.   No murmur heard. Pulmonary/Chest: Effort normal and breath sounds normal. No respiratory distress. He has no wheezes. He has no rales. He exhibits no tenderness.  Abdominal: Soft. Bowel sounds are normal. He exhibits no distension and no mass. There is no tenderness. There is no rebound and no  guarding.  Musculoskeletal: He exhibits no edema.  Lymphadenopathy:    He has no cervical adenopathy.  Neurological: He is alert and oriented to person, place, and time. He has normal patellar reflexes. He exhibits normal muscle tone. Coordination normal.  Skin: Skin is warm and dry.  Psychiatric: He has a normal mood and affect. His behavior is normal. Judgment and thought content normal.           Assessment & Plan:        Assessment & Plan:   Problem List Items Addressed This Visit       Unprioritized   Preventative health care - Primary     Recent weight gain with a peak weight of 200 lbs approximately 6 weeks ago. Patient has initiated dietary modifications and increased physical activity, resulting in a 6-7 lb weight loss. Goal weight is 175-180 lbs. -Continue current dietary modifications and physical activity. -Last colon cancer screening with Cologuard was in 2022, next due in December 2025. -Encouraged to receive influenza vaccine and COVID-19 booster at the pharmacy in the fall. -Order comprehensive metabolic panel, lipid panel, and thyroid function tests as it has been 2 years since last full panel.(Pt is aware that insurance may not cover theses screening labs). -Continue regular dental and vision check-ups. -Follow-up in 1 year if no issues arise.      Relevant Orders   Comp Met (CMET)   Lipid panel   TSH   Other Visit Diagnoses     Thrombocytopenia (HCC)       Relevant Orders   CBC w/Diff       I am having Caryn Bee Wlodarczyk maintain his Probiotic Product (PROBIOTIC ADVANCED PO), cetirizine, ipratropium, Pulmicort Flexhaler, and albuterol.  No orders of the defined  types were placed in this encounter.

## 2023-07-20 NOTE — Patient Instructions (Signed)
VISIT SUMMARY:  During your recent visit, we discussed your concerns about weight gain, your right leg pain, and your history of thrombocytopenia. You have made commendable progress in managing your weight through dietary changes and increased physical activity. Your right leg pain is improving with physical therapy, and there are no current concerns with your thrombocytopenia.  YOUR PLAN:  -WEIGHT MANAGEMENT: You have gained some weight recently, but your efforts in diet control and physical activity have helped you lose 6-7 lbs. Your goal is to reach 175-180 lbs. Please continue with your current diet and exercise routine.  -MUSCULOSKELETAL PAIN: You have been experiencing pain in your right leg, which is improving with physical therapy. Please continue with the exercises as directed by your therapist.  -THROMBOCYTOPENIA: Thrombocytopenia is a condition where you have a lower than normal number of platelets in your blood. Currently, you have no symptoms or concerns related to this condition. We will order a complete blood count to monitor your platelet count.  -GENERAL HEALTH MAINTENANCE: Your last colon cancer screening was in 2022, and the next one is due in December 2025. We encourage you to get your influenza vaccine and COVID-19 booster at the pharmacy in the fall. We will also order a comprehensive metabolic panel, lipid panel, and thyroid function tests as it has been 2 years since your last full panel. Please continue with your regular dental and vision check-ups.  INSTRUCTIONS:  Please continue with your current diet and exercise routine for weight management. Keep doing your physical therapy exercises for your right leg pain. We will monitor your platelet count for thrombocytopenia. Remember to get your influenza vaccine and COVID-19 booster in the fall. We will order some tests for you, and you should continue with your regular dental and vision check-ups. If no issues arise, we will see  you for a follow-up in 1 year.

## 2023-07-20 NOTE — Assessment & Plan Note (Signed)
  Recent weight gain with a peak weight of 200 lbs approximately 6 weeks ago. Patient has initiated dietary modifications and increased physical activity, resulting in a 6-7 lb weight loss. Goal weight is 175-180 lbs. -Continue current dietary modifications and physical activity. -Last colon cancer screening with Cologuard was in 2022, next due in December 2025. -Encouraged to receive influenza vaccine and COVID-19 booster at the pharmacy in the fall. -Order comprehensive metabolic panel, lipid panel, and thyroid function tests as it has been 2 years since last full panel.(Pt is aware that insurance may not cover theses screening labs). -Continue regular dental and vision check-ups. -Follow-up in 1 year if no issues arise.

## 2023-07-22 ENCOUNTER — Telehealth: Payer: Self-pay | Admitting: Family

## 2023-07-22 DIAGNOSIS — R7989 Other specified abnormal findings of blood chemistry: Secondary | ICD-10-CM

## 2023-07-22 NOTE — Telephone Encounter (Signed)
See mychart.  

## 2023-07-23 ENCOUNTER — Encounter: Payer: Self-pay | Admitting: Physical Therapy

## 2023-07-23 ENCOUNTER — Ambulatory Visit: Payer: BC Managed Care – PPO | Admitting: Physical Therapy

## 2023-07-23 DIAGNOSIS — M5416 Radiculopathy, lumbar region: Secondary | ICD-10-CM | POA: Diagnosis not present

## 2023-07-23 DIAGNOSIS — G5701 Lesion of sciatic nerve, right lower limb: Secondary | ICD-10-CM

## 2023-07-23 NOTE — Therapy (Signed)
OUTPATIENT PHYSICAL THERAPY THORACOLUMBAR TREATMENT   Patient Name: Larry Brooks MRN: [id number] DOB:[birth year], 47 y.o., male Today's Date: 07/23/2023  END OF SESSION:  PT End of Session - 07/23/23 1226     Visit Number 6    Date for PT Re-Evaluation 08/21/23    PT Start Time 1228    PT Stop Time 1310    PT Time Calculation (min) 42 min    Activity Tolerance Patient tolerated treatment well    Behavior During Therapy [facility] for tasks assessed/performed                 Past Medical History:  Diagnosis Date   Childhood asthma    Generalized anxiety disorder    Leukopenia    Thrombocytopenia (HCC)    Past Surgical History:  Procedure Laterality Date   TONSILLECTOMY     WISDOM TOOTH EXTRACTION     Patient Active Problem List   Diagnosis Date Noted   Abnormal LFTs 07/22/2023   COVID-19 long hauler manifesting chronic cough 01/30/2023   Labral tear of shoulder, degenerative, right 07/17/2022   Seasonal and perennial allergic rhinitis 03/27/2022   Adverse food reaction 03/27/2022   Mild persistent asthma 03/27/2022   Recurrent sinusitis 03/27/2022   Allergies 11/09/2021   Preventative health care 07/15/2021   ALLERGIC RHINITIS 10/31/2010   GENERALIZED ANXIETY DISORDER 12/08/2008   HYPERLIPIDEMIA 12/19/2007    PCP: Sandford Craze  REFERRING PROVIDER: Clementeen Graham  REFERRING DIAG: M54.16 Lumbar Radiculopathy   Rationale for Evaluation and Treatment: Rehabilitation  THERAPY DIAG:  Piriformis syndrome of right side  Radiculopathy, lumbar region  ONSET DATE: 07/18/23  SUBJECTIVE:                                                                                                                                                                                           SUBJECTIVE STATEMENT: Patient reports he is stiff today after he had to walk a lot yesterday. He also reports some pain in his lateral R lower leg.  PERTINENT HISTORY:  Today, pt c/o R leg  pain ongoing since the beginning of the year, worsening mid-May. Pt locates pain to the toes of his R foot and the lateral aspect of his R lower leg. Symptoms vary and seem to be exacerbated by activity.  Pain is worse at the right lateral calf.  PAIN:  Are you having pain? Yes: NPRS scale: 2-3/10 Pain location: lateral R hip and thigh Pain description: sharp, shooting, electrical, sting, worse in the morning  Aggravating factors: I cannot tell right now  Relieving factors: moving around sometimes  PRECAUTIONS: None  RED FLAGS: None  WEIGHT BEARING RESTRICTIONS: No  FALLS:  Has patient fallen in last 6 months? No  LIVING ENVIRONMENT: Lives with: lives with their family Lives in: House/apartment  OCCUPATION: Emergency planning/management officer at [employer], [employer]   PLOF: Independent  PATIENT GOALS: get to where I can play tennis without being hesitant    OBJECTIVE:   DIAGNOSTIC FINDINGS:  FINDINGS: There is no evidence of lumbar spine fracture. Alignment is normal. Intervertebral disc spaces are maintained.   IMPRESSION: Negative exam  PATIENT SURVEYS:  FOTO [id number]  COGNITION: Overall cognitive status: Within functional limits for tasks assessed     SENSATION: WFL  MUSCLE LENGTH: Hamstrings: very tight in BLE  POSTURE: No Significant postural limitations  PALPATION: No remarkable findings, just very tight   LUMBAR ROM:   AROM eval  Flexion 75% a little painful and tight  Extension WFL  Right lateral flexion "A little bit of tightness, can feel that in the back on leg"  Left lateral flexion "A little bit of tightness"  Right rotation WFL  Left rotation WFL   (Blank rows = not tested)  LOWER EXTREMITY ROM:   grossly WFL   LOWER EXTREMITY MMT:  5/5   LUMBAR SPECIAL TESTS:  Straight leg raise test: Positive and Slump test: Positive  TODAY'S TREATMENT:                                                                                                                               DATE:  07/23/23 NuStep L5 x 6 minutes STM to R gluts, piriformis, ITB in L sidelying, F/B stretch to each area as well Supine stabilization with iso abd/add, 5 sec holds x 10 each Supine with legs over Green physioball- Figure 4 stretch, bridge, bridge with hip IR, Ball roll ups x 10 each Monster walks with G tband at knees, 10 forward and back SLS on airex, stride ups x 10 with each leg, no UE support SLS bending to touch target 14" from floor. Lots of VC for technique, 12 reps each  07/16/23 NuStep L5 x 6 minutes Cobra pose, push up as high as possible while still keeping hips in contact with the mat, 2 x 30 sec. Child's pose, very tight Cat/Cow with limited motion noted. Quadruped alternating arm reach, leg reach, bird dog x 4 each side. Quadruped thread the needle x 3 each direction. Seated hs stretch with alternating leg on mat. Seated piriformis stretch with leg on mat, 3 x 15 sec on each side.  07/09/23 NuStep L5 x 6 minutes Massage gun to R piriformis Supine 3 way hip stretch with strap Seated HS stretch Standing add stretch Sit to stand while standing on Airex pad with G tband at knees, OHP with 4# ball at the top of the stand x 10, repeat with chest press x 10 BOSU ball, upside down, static standing, lat weight shifts, ant/post weight shifts, then circles in each direction- impressive control. Alternating stp ups onto 6"  box on top of Airex, 5 reps each leg.  07/02/23 NuStep L5 x 6 minutes. Soft tissue assessment in R hip, mild tightness in piriformis, HS tight, no TTP LE stretch for piriformis, HS, hip musculature with strap. 2 x 15 sec each RLE.  Seated clamshells against G tband resistance, 2 x 10 B side stepping against G Tband resistance, 3 x 5 steps each way. Mini squats, 2 x 10 reps  06/19/23 NuStep L5 x 6 mins Open/close gate red t-band 2x10 Prone extension stretch with added overpressure Prone hip ext 2x15 R pirifromis deep massage with theragun Ball squeeze  and bridge 2x10 Trunk rotation 2x10 SLR with R 3Lb 2x10 LE stretching/PROM  EVAL 06/12/23   PATIENT EDUCATION:  Education details: HEP, POC, piriformis syndrome and sciatica  Person educated: Patient Education method: Explanation Education comprehension: verbalized understanding  HOME EXERCISE PROGRAM: Access Code: [email or web address] URL: [email or web address] Date: 06/12/2023 Prepared by: Cassie Freer  Exercises - Supine Piriformis Stretch  - 1 x daily - 7 x weekly - 2 sets - 10 reps - Supine Sciatic Nerve Glide  - 1 x daily - 7 x weekly - 2 sets - 10 reps - Supine Hamstring Stretch with Strap  - 1 x daily - 7 x weekly - 2 reps - 30 hold - Gastroc Stretch on Wall  - 1 x daily - 7 x weekly - 1 sets - 2 reps - 30 hold - Seated Hamstring Stretch  - 1 x daily - 7 x weekly - 1 sets - 2 reps - 30 hold - Seated Piriformis Stretch with Trunk Bend  - 1 x daily - 7 x weekly - 1 sets - 2 reps - 30 hold  ASSESSMENT:  CLINICAL IMPRESSION: Patient reports continued improvement in his symptoms, with decreased frequency. He is more stiff and sore today due to increased activity working in yard and a lot of walking over the weekend. Provided some deep tissue mobs and stretch for entire hip and outer leg. Then moved to stability exercises and balance, including SLS for stability in standing.   OBJECTIVE IMPAIRMENTS: Abnormal gait, difficulty walking, decreased ROM, hypomobility, and pain.   REHAB POTENTIAL: Good  CLINICAL DECISION MAKING: Stable/uncomplicated  EVALUATION COMPLEXITY: Low   GOALS: Goals reviewed with patient? Yes  SHORT TERM GOALS: Target date: 07/17/23  Patient will be independent with initial HEP.  Goal status: 07/02/23- Met   LONG TERM GOALS: Target date: 08/01/23  Patient will be independent with advanced/ongoing HEP to improve outcomes and carryover.  Goal status: INITIAL  2.  Patient will decrease numbness and tingling in RLE at least 50%  Goal status:  07/23/23- at least 40% improved, ongoing  3.  Patient will demonstrate full pain free lumbar ROM to perform ADLs.   Baseline: see chart above Goal status: 07/23/23 met  4.  Patient will demonstrate improved flexibility in hamstrings and piriformis Baseline: visible shaking and some pain with stretching  Goal status: 07/09/23-HS remain severely tight. ongoing   PLAN:  PT FREQUENCY: 1x/week  PT DURATION: 10 weeks  PLANNED INTERVENTIONS: Therapeutic exercises, Therapeutic activity, Neuromuscular re-education, Balance training, Gait training, Patient/Family education, Self Care, Joint mobilization, Dry Needling, Electrical stimulation, Spinal manipulation, Spinal mobilization, Cryotherapy, Moist heat, Traction, Ionotophoresis 4mg /ml Dexamethasone, and Manual therapy.  PLAN FOR NEXT SESSION: continue with stretching, might benefit from traction    Iona Beard, DPT 07/23/2023, 1:11 PM

## 2023-08-01 NOTE — Therapy (Signed)
OUTPATIENT PHYSICAL THERAPY THORACOLUMBAR TREATMENT   Patient Name: Larry Brooks MRN: [id number] DOB:Oct 30, 1976, 47 y.o., male Today's Date: 08/02/2023  END OF SESSION:  PT End of Session - 08/02/23 1230     Visit Number 7    Date for PT Re-Evaluation 08/21/23    PT Start Time 1230    PT Stop Time 1315    PT Time Calculation (min) 45 min    Activity Tolerance Patient tolerated treatment well    Behavior During Therapy [facility] for tasks assessed/performed                  Past Medical History:  Diagnosis Date   Childhood asthma    Generalized anxiety disorder    Leukopenia    Thrombocytopenia (HCC)    Past Surgical History:  Procedure Laterality Date   TONSILLECTOMY     WISDOM TOOTH EXTRACTION     Patient Active Problem List   Diagnosis Date Noted   Abnormal LFTs 07/22/2023   COVID-19 long hauler manifesting chronic cough 01/30/2023   Labral tear of shoulder, degenerative, right 07/17/2022   Seasonal and perennial allergic rhinitis 03/27/2022   Adverse food reaction 03/27/2022   Mild persistent asthma 03/27/2022   Recurrent sinusitis 03/27/2022   Allergies 11/09/2021   Preventative health care 07/15/2021   ALLERGIC RHINITIS 10/31/2010   GENERALIZED ANXIETY DISORDER 12/08/2008   HYPERLIPIDEMIA 12/19/2007    PCP: Sandford Craze  REFERRING PROVIDER: Clementeen Graham  REFERRING DIAG: M54.16 Lumbar Radiculopathy   Rationale for Evaluation and Treatment: Rehabilitation  THERAPY DIAG:  Piriformis syndrome of right side  Radiculopathy, lumbar region  ONSET DATE: 07/18/23  SUBJECTIVE:                                                                                                                                                                                           SUBJECTIVE STATEMENT: A little bit of pain in my hamstring and calf. I got a massage gun and that has been helping.   PERTINENT HISTORY:  Today, pt c/o R leg pain ongoing since the beginning  of the year, worsening mid-May. Pt locates pain to the toes of his R foot and the lateral aspect of his R lower leg. Symptoms vary and seem to be exacerbated by activity.  Pain is worse at the right lateral calf.  PAIN:  Are you having pain? Yes: NPRS scale: 2-3/10 Pain location: lateral R hip and thigh Pain description: sharp, shooting, electrical, sting, worse in the morning  Aggravating factors: I cannot tell right now  Relieving factors: moving around sometimes  PRECAUTIONS: None  RED FLAGS: None   WEIGHT BEARING  RESTRICTIONS: No  FALLS:  Has patient fallen in last 6 months? 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 Alignment is normal. Intervertebral disc spaces are maintained.   IMPRESSION: Negative exam  PATIENT SURVEYS:  FOTO 72  COGNITION: Overall cognitive status: Within functional limits for tasks assessed     SENSATION: WFL  MUSCLE LENGTH: Hamstrings: very tight in BLE  POSTURE: No Significant postural limitations  PALPATION: No remarkable findings, just very tight   LUMBAR ROM:   AROM eval  Flexion 75% a little painful and tight  Extension WFL  Right lateral flexion "A little bit of tightness, can feel that in the back on leg"  Left lateral flexion "A little bit of tightness"  Right rotation WFL  Left rotation WFL   (Blank rows = not tested)  LOWER EXTREMITY ROM:   grossly WFL   LOWER EXTREMITY MMT:  5/5   LUMBAR SPECIAL TESTS:  Straight leg raise test: Positive and Slump test: Positive  TODAY'S TREATMENT:                                                                                                                              DATE:  08/01/23 NuStep L5x63mins   HS stretch 30s  Glute and piriformis stretch 30s ITB stretch 30s  Sidelying abd 2x10 Sidelying clamshells green 2x10  STS with OHP 2x10  Calf stretch on slant 15s x3  2 way hip 5# cables 2x10  07/23/23 NuStep L5 x 6 minutes STM to R gluts, piriformis, ITB in L sidelying, F/B stretch to each area as well Supine stabilization with iso abd/add, 5 sec holds x 10 each Supine with legs over Green physioball- Figure 4 stretch, bridge, bridge with hip IR, Ball roll ups x 10 each Monster walks with G tband at knees, 10 forward and back SLS on airex, stride ups x 10 with each leg, no UE support SLS bending to touch target 14" from floor. Lots of VC for technique, 12 reps each  07/16/23 NuStep L5 x 6 minutes Cobra pose, push up as high as possible while still keeping hips in contact with the mat, 2 x 30 sec. Child's pose, very tight Cat/Cow with limited motion noted. Quadruped alternating arm reach, leg reach, bird dog x 4 each side. Quadruped thread the needle x 3 each direction. Seated hs stretch with alternating leg on mat. Seated piriformis stretch with leg on mat, 3 x 15 sec on each side.  07/09/23 NuStep L5 x 6 minutes Massage gun to R piriformis Supine 3 way hip stretch with strap Seated HS stretch Standing add stretch Sit to stand while standing on Airex pad with G tband at knees,  OHP with 4# ball at the top of the stand x 10, repeat with chest press x 10 BOSU ball, upside down, static standing, lat weight shifts, ant/post weight shifts, then circles in each direction- impressive control. Alternating stp ups onto 6" box on top of Airex, 5 reps each leg.  07/02/23 NuStep L5 x 6 minutes. Soft tissue assessment in R hip, mild tightness in piriformis, HS tight, no TTP LE stretch for piriformis, HS, hip musculature with strap. 2 x 15 sec each RLE.  Seated clamshells against G tband resistance, 2 x 10 B side stepping against G Tband resistance, 3 x 5 steps each way. Mini squats, 2 x  10 reps  06/19/23 NuStep L5 x 6 mins Open/close gate red t-band 2x10 Prone extension stretch with added overpressure Prone hip ext 2x15 R pirifromis deep massage with theragun Ball squeeze and bridge 2x10 Trunk rotation 2x10 SLR with R 3Lb 2x10 LE stretching/PROM  EVAL 06/12/23   PATIENT EDUCATION:  Education details: HEP, POC, piriformis syndrome and sciatica  Person educated: Patient Education method: Explanation Education comprehension: verbalized understanding  HOME EXERCISE PROGRAM: Access Code: [email or web address] URL: [email or web address] Date: 06/12/2023 Prepared by: Cassie Freer  Exercises - Supine Piriformis Stretch  - 1 x daily - 7 x weekly - 2 sets - 10 reps - Supine Sciatic Nerve Glide  - 1 x daily - 7 x weekly - 2 sets - 10 reps - Supine Hamstring Stretch with Strap  - 1 x daily - 7 x weekly - 2 reps - 30 hold - Gastroc Stretch on Wall  - 1 x daily - 7 x weekly - 1 sets - 2 reps - 30 hold - Seated Hamstring Stretch  - 1 x daily - 7 x weekly - 1 sets - 2 reps - 30 hold - Seated Piriformis Stretch with Trunk Bend  - 1 x daily - 7 x weekly - 1 sets - 2 reps - 30 hold  ASSESSMENT:  CLINICAL IMPRESSION: Patient reports continued improvement in his symptoms, with decreased frequency. Still has some tightness in hamstrings and piriformis. Reports stretching has been helping and feels good. Some muscle fatigue and burning in hips with 2 way hip exercise and sidelying hip abduction.    OBJECTIVE IMPAIRMENTS: Abnormal gait, difficulty walking, decreased ROM, hypomobility, and pain.   REHAB POTENTIAL: Good  CLINICAL DECISION MAKING: Stable/uncomplicated  EVALUATION COMPLEXITY: Low   GOALS: Goals reviewed with patient? Yes  SHORT TERM GOALS: Target date: 07/17/23  Patient will be independent with initial HEP.  Goal status: 07/02/23- Met   LONG TERM GOALS: Target date: 08/01/23  Patient will be independent with advanced/ongoing HEP to improve outcomes and  carryover.  Goal status: INITIAL  2.  Patient will decrease numbness and tingling in RLE at least 50%  Goal status: 07/23/23- at least 40% improved, ongoing  3.  Patient will demonstrate full pain free lumbar ROM to perform ADLs.   Baseline: see chart above Goal status: 07/23/23 met  4.  Patient will demonstrate improved flexibility in hamstrings and piriformis Baseline: visible shaking and some pain with stretching  Goal status: 07/09/23-HS remain severely tight. ongoing   PLAN:  PT FREQUENCY: 1x/week  PT DURATION: 10 weeks  PLANNED INTERVENTIONS: Therapeutic exercises, Therapeutic activity, Neuromuscular re-education, Balance training, Gait training, Patient/Family education, Self Care, Joint mobilization, Dry Needling, Electrical stimulation, Spinal manipulation, Spinal mobilization, Cryotherapy, Moist heat, Traction, Ionotophoresis 4mg /ml Dexamethasone, and Manual therapy.  PLAN FOR NEXT SESSION: continue with stretching, might benefit from traction  Iona Beard, DPT 08/02/2023, 1:09 PM

## 2023-08-02 ENCOUNTER — Ambulatory Visit: Payer: BC Managed Care – PPO | Attending: Family Medicine

## 2023-08-02 DIAGNOSIS — G5701 Lesion of sciatic nerve, right lower limb: Secondary | ICD-10-CM | POA: Insufficient documentation

## 2023-08-02 DIAGNOSIS — M5416 Radiculopathy, lumbar region: Secondary | ICD-10-CM | POA: Insufficient documentation

## 2023-08-08 NOTE — Therapy (Signed)
OUTPATIENT PHYSICAL THERAPY THORACOLUMBAR TREATMENT   Patient Name: Larry Brooks MRN: [id number] DOB:Aug 06, 1976, 47 y.o., male Today's Date: 08/09/2023  END OF SESSION:  PT End of Session - 08/09/23 1230     Visit Number 8    Date for PT Re-Evaluation 08/21/23    PT Start Time 1230    PT Stop Time 1315    PT Time Calculation (min) 45 min    Activity Tolerance Patient tolerated treatment well    Behavior During Therapy [facility] for tasks assessed/performed                   Past Medical History:  Diagnosis Date   Childhood asthma    Generalized anxiety disorder    Leukopenia    Thrombocytopenia (HCC)    Past Surgical History:  Procedure Laterality Date   TONSILLECTOMY     WISDOM TOOTH EXTRACTION     Patient Active Problem List   Diagnosis Date Noted   Abnormal LFTs 07/22/2023   COVID-19 long hauler manifesting chronic cough 01/30/2023   Labral tear of shoulder, degenerative, right 07/17/2022   Seasonal and perennial allergic rhinitis 03/27/2022   Adverse food reaction 03/27/2022   Mild persistent asthma 03/27/2022   Recurrent sinusitis 03/27/2022   Allergies 11/09/2021   Preventative health care 07/15/2021   ALLERGIC RHINITIS 10/31/2010   GENERALIZED ANXIETY DISORDER 12/08/2008   HYPERLIPIDEMIA 12/19/2007    PCP: Sandford Craze  REFERRING PROVIDER: Clementeen Graham  REFERRING DIAG: M54.16 Lumbar Radiculopathy   Rationale for Evaluation and Treatment: Rehabilitation  THERAPY DIAG:  Piriformis syndrome of right side  Radiculopathy, lumbar region  ONSET DATE: 07/18/23  SUBJECTIVE:                                                                                                                                                                                           SUBJECTIVE STATEMENT: Things are going okay, my son has been helping with my stretches. Still get some N/T in the foot but it is not as bad as it used to be or as often.   PERTINENT  HISTORY:  Today, pt c/o R leg pain ongoing since the beginning of the year, worsening mid-May. Pt locates pain to the toes of his R foot and the lateral aspect of his R lower leg. Symptoms vary and seem to be exacerbated by activity.  Pain is worse at the right lateral calf.  PAIN:  Are you having pain? Yes: NPRS scale: 2-3/10 Pain location: lateral R hip and thigh Pain description: sharp, shooting, electrical, sting, worse in the morning  Aggravating factors: I cannot tell right now  Relieving factors:  moving around sometimes  PRECAUTIONS: None  RED FLAGS: None   WEIGHT BEARING RESTRICTIONS: No  FALLS:  Has patient fallen in last 6 months? 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 Alignment is normal. Intervertebral disc spaces are maintained.   IMPRESSION: Negative exam  PATIENT SURVEYS:  FOTO 72  COGNITION: Overall cognitive status: Within functional limits for tasks assessed     SENSATION: WFL  MUSCLE LENGTH: Hamstrings: very tight in BLE  POSTURE: No Significant postural limitations  PALPATION: No remarkable findings, just very tight   LUMBAR ROM:   AROM eval  Flexion 75% a little painful and tight  Extension WFL  Right lateral flexion "A little bit of tightness, can feel that in the back on leg"  Left lateral flexion "A little bit of tightness"  Right rotation WFL  Left rotation WFL   (Blank rows = not tested)  LOWER EXTREMITY ROM:   grossly WFL   LOWER EXTREMITY MMT:  5/5   LUMBAR SPECIAL TESTS:  Straight leg raise test: Positive and Slump test: Positive  TODAY'S TREATMENT:                                                                                                                               DATE:  08/09/23 NuStep L5 x39mins  Calf stretch Resisted gait 20# 4 way x4  Seated piriformis and HS stretch 30s  Prone cobra x10 Prone hip ext x10  HS curls 25# 2x10 Leg ext 10# 2x10 STS with hip abd green against band 2x10    08/01/23 NuStep L5x51mins  HS stretch 30s  Glute and piriformis stretch 30s ITB stretch 30s  Sidelying abd 2x10 Sidelying clamshells green 2x10  STS with OHP 2x10  Calf stretch on slant 15s x3  2 way hip 5# cables 2x10  07/23/23 NuStep L5 x 6 minutes STM to R gluts, piriformis, ITB in L sidelying, F/B stretch to each area as well Supine stabilization with iso abd/add, 5 sec holds x 10 each Supine with legs over Green physioball- Figure 4 stretch, bridge, bridge with hip IR, Ball roll ups x 10 each Monster walks with G tband at knees, 10 forward and back SLS on airex, stride ups x 10 with each leg, no UE support SLS bending to touch target 14" from floor. Lots of VC for technique, 12 reps each  07/16/23 NuStep L5 x 6 minutes Cobra pose, push up as high as possible while still keeping hips in contact with the mat, 2 x 30 sec. Child's pose, very tight Cat/Cow with limited motion noted. Quadruped alternating arm reach, leg reach, bird dog x 4 each side. Quadruped thread the needle x 3  each direction. Seated hs stretch with alternating leg on mat. Seated piriformis stretch with leg on mat, 3 x 15 sec on each side.  07/09/23 NuStep L5 x 6 minutes Massage gun to R piriformis Supine 3 way hip stretch with strap Seated HS stretch Standing add stretch Sit to stand while standing on Airex pad with G tband at knees, OHP with 4# ball at the top of the stand x 10, repeat with chest press x 10 BOSU ball, upside down, static standing, lat weight shifts, ant/post weight shifts, then circles in each direction- impressive control. Alternating stp ups onto 6" box on top of Airex, 5 reps each leg.  07/02/23 NuStep L5 x 6 minutes. Soft  tissue assessment in R hip, mild tightness in piriformis, HS tight, no TTP LE stretch for piriformis, HS, hip musculature with strap. 2 x 15 sec each RLE.  Seated clamshells against G tband resistance, 2 x 10 B side stepping against G Tband resistance, 3 x 5 steps each way. Mini squats, 2 x 10 reps  06/19/23 NuStep L5 x 6 mins Open/close gate red t-band 2x10 Prone extension stretch with added overpressure Prone hip ext 2x15 R pirifromis deep massage with theragun Ball squeeze and bridge 2x10 Trunk rotation 2x10 SLR with R 3Lb 2x10 LE stretching/PROM  EVAL 06/12/23   PATIENT EDUCATION:  Education details: HEP, POC, piriformis syndrome and sciatica  Person educated: Patient Education method: Explanation Education comprehension: verbalized understanding  HOME EXERCISE PROGRAM: Access Code: [email or web address] URL: [email or web address] Date: 06/12/2023 Prepared by: Cassie Freer  Exercises - Supine Piriformis Stretch  - 1 x daily - 7 x weekly - 2 sets - 10 reps - Supine Sciatic Nerve Glide  - 1 x daily - 7 x weekly - 2 sets - 10 reps - Supine Hamstring Stretch with Strap  - 1 x daily - 7 x weekly - 2 reps - 30 hold - Gastroc Stretch on Wall  - 1 x daily - 7 x weekly - 1 sets - 2 reps - 30 hold - Seated Hamstring Stretch  - 1 x daily - 7 x weekly - 1 sets - 2 reps - 30 hold - Seated Piriformis Stretch with Trunk Bend  - 1 x daily - 7 x weekly - 1 sets - 2 reps - 30 hold  ASSESSMENT:  CLINICAL IMPRESSION: Patient reports continued improvement in his symptoms, with decreased frequency. Still has some tightness in hamstrings and piriformis. Reports stretching has been helping and feels good. Feels that he is where at a good point and will be ready for d/c next visit.    OBJECTIVE IMPAIRMENTS: Abnormal gait, difficulty walking, decreased ROM, hypomobility, and pain.   REHAB POTENTIAL: Good  CLINICAL DECISION MAKING: Stable/uncomplicated  EVALUATION COMPLEXITY:  Low   GOALS: Goals reviewed with patient? Yes  SHORT TERM GOALS: Target date: 07/17/23  Patient will be independent with initial HEP.  Goal status: 07/02/23- Met   LONG TERM GOALS: Target date: 08/01/23  Patient will be independent with advanced/ongoing HEP to improve outcomes and carryover.  Goal status: INITIAL  2.  Patient will decrease numbness and tingling in RLE at least 50%  Goal status: 07/23/23- at least 40% improved, ongoing, MET 08/09/23  3.  Patient will demonstrate full pain free lumbar ROM to perform ADLs.   Baseline: see chart above Goal status: 07/23/23 met  4.  Patient will demonstrate improved flexibility in hamstrings and piriformis Baseline: visible shaking and some pain with stretching  Goal status: 07/09/23-HS remain  severely tight. ongoing   PLAN:  PT FREQUENCY: 1x/week  PT DURATION: 10 weeks  PLANNED INTERVENTIONS: Therapeutic exercises, Therapeutic activity, Neuromuscular re-education, Balance training, Gait training, Patient/Family education, Self Care, Joint mobilization, Dry Needling, Electrical stimulation, Spinal manipulation, Spinal mobilization, Cryotherapy, Moist heat, Traction, Ionotophoresis 4mg /ml Dexamethasone, and Manual therapy.  PLAN FOR NEXT SESSION: continue with stretching, might benefit from traction    Cassie Freer, DPT 08/09/2023, 1:15 PM

## 2023-08-09 ENCOUNTER — Ambulatory Visit: Payer: BC Managed Care – PPO

## 2023-08-09 DIAGNOSIS — M5416 Radiculopathy, lumbar region: Secondary | ICD-10-CM

## 2023-08-09 DIAGNOSIS — G5701 Lesion of sciatic nerve, right lower limb: Secondary | ICD-10-CM | POA: Diagnosis not present

## 2023-08-10 ENCOUNTER — Ambulatory Visit (HOSPITAL_BASED_OUTPATIENT_CLINIC_OR_DEPARTMENT_OTHER)
Admission: RE | Admit: 2023-08-10 | Discharge: 2023-08-10 | Disposition: A | Discharge status: Home / Self Care | Payer: BC Managed Care – PPO | Source: Ambulatory Visit | Attending: Family | Admitting: Family

## 2023-08-10 ENCOUNTER — Encounter: Payer: Self-pay | Admitting: Family

## 2023-08-10 DIAGNOSIS — R748 Abnormal levels of other serum enzymes: Secondary | ICD-10-CM | POA: Diagnosis not present

## 2023-08-10 DIAGNOSIS — R17 Unspecified jaundice: Secondary | ICD-10-CM

## 2023-08-10 DIAGNOSIS — R7989 Other specified abnormal findings of blood chemistry: Secondary | ICD-10-CM

## 2023-08-14 ENCOUNTER — Other Ambulatory Visit (INDEPENDENT_AMBULATORY_CARE_PROVIDER_SITE_OTHER): Payer: BC Managed Care – PPO

## 2023-08-14 DIAGNOSIS — R7989 Other specified abnormal findings of blood chemistry: Secondary | ICD-10-CM | POA: Diagnosis not present

## 2023-08-15 ENCOUNTER — Encounter: Payer: Self-pay | Admitting: Family

## 2023-08-15 NOTE — Addendum Note (Signed)
Addended by: Sandford Craze on: 08/15/2023 04:22 PM   Modules accepted: Orders

## 2023-08-15 NOTE — Therapy (Signed)
OUTPATIENT PHYSICAL THERAPY THORACOLUMBAR TREATMENT   Patient Name: Larry Brooks MRN: [id number] DOB:1976/05/19, 47 y.o., male Today's Date: 08/15/2023  END OF SESSION:          Past Medical History:  Diagnosis Date   Childhood asthma    Generalized anxiety disorder    Leukopenia    Thrombocytopenia (HCC)    Past Surgical History:  Procedure Laterality Date   TONSILLECTOMY     WISDOM TOOTH EXTRACTION     Patient Active Problem List   Diagnosis Date Noted   Abnormal LFTs 07/22/2023   COVID-19 long hauler manifesting chronic cough 01/30/2023   Labral tear of shoulder, degenerative, right 07/17/2022   Seasonal and perennial allergic rhinitis 03/27/2022   Adverse food reaction 03/27/2022   Mild persistent asthma 03/27/2022   Recurrent sinusitis 03/27/2022   Allergies 11/09/2021   Preventative health care 07/15/2021   ALLERGIC RHINITIS 10/31/2010   GENERALIZED ANXIETY DISORDER 12/08/2008   HYPERLIPIDEMIA 12/19/2007    PCP: Sandford Craze  REFERRING PROVIDER: Clementeen Graham  REFERRING DIAG: M54.16 Lumbar Radiculopathy   Rationale for Evaluation and Treatment: Rehabilitation  THERAPY DIAG:  No diagnosis found.  ONSET DATE: 07/18/23  SUBJECTIVE:                                                                                                                                                                                           SUBJECTIVE STATEMENT: Things are going okay, my son has been helping with my stretches. Still get some N/T in the foot but it is not as bad as it used to be or as often.   PERTINENT HISTORY:  Today, pt c/o R leg pain ongoing since the beginning of the year, worsening mid-May. Pt locates pain to the toes of his R foot and the lateral aspect of his R lower leg. Symptoms vary and seem to be exacerbated by activity.  Pain is worse at the right lateral calf.  PAIN:  Are you having pain? Yes: NPRS scale: 2-3/10 Pain location: lateral R  hip and thigh Pain description: sharp, shooting, electrical, sting, worse in the morning  Aggravating factors: I cannot tell right now  Relieving factors: moving around sometimes  PRECAUTIONS: None  RED FLAGS: None   WEIGHT BEARING RESTRICTIONS: No  FALLS:  Has patient fallen in last 6 months? No  LIVING ENVIRONMENT: Lives with: lives with their family Lives in: House/apartment  OCCUPATION: Emergency planning/management officer at [employer], [employer]   PLOF: Independent  PATIENT GOALS: get to where I can play tennis without being hesitant    OBJECTIVE:   DIAGNOSTIC FINDINGS:  FINDINGS: There is no evidence of lumbar spine  fracture. Alignment is normal. Intervertebral disc spaces are maintained.   IMPRESSION: Negative exam  PATIENT SURVEYS:  FOTO 72  COGNITION: Overall cognitive status: Within functional limits for tasks assessed     SENSATION: WFL  MUSCLE LENGTH: Hamstrings: very tight in BLE  POSTURE: No Significant postural limitations  PALPATION: No remarkable findings, just very tight   LUMBAR ROM:   AROM eval  Flexion 75% a little painful and tight  Extension WFL  Right lateral flexion "A little bit of tightness, can feel that in the back on leg"  Left lateral flexion "A little bit of tightness"  Right rotation WFL  Left rotation WFL   (Blank rows = not tested)  LOWER EXTREMITY ROM:   grossly WFL   LOWER EXTREMITY MMT:  5/5   LUMBAR SPECIAL TESTS:  Straight leg raise test: Positive and Slump test: Positive  TODAY'S TREATMENT:                                                                                                                              DATE:  08/16/23 NuStep Elliptical Deadlifts  Lateral band walks Monster walk  Leg press HS, IT band, piriformis stretching    08/09/23 NuStep L5 x39mins  Calf stretch Resisted gait 20# 4 way x4  Seated piriformis and HS stretch 30s  Prone cobra x10 Prone hip ext x10  HS curls 25# 2x10 Leg ext 10# 2x10 STS  with hip abd green against band 2x10    08/01/23 NuStep L5x30mins  HS stretch 30s  Glute and piriformis stretch 30s ITB stretch 30s  Sidelying abd 2x10 Sidelying clamshells green 2x10  STS with OHP 2x10  Calf stretch on slant 15s x3  2 way hip 5# cables 2x10  07/23/23 NuStep L5 x 6 minutes STM to R gluts, piriformis, ITB in L sidelying, F/B stretch to each area as well Supine stabilization with iso abd/add, 5 sec holds x 10 each Supine with legs over Green physioball- Figure 4 stretch, bridge, bridge with hip IR, Ball roll ups x 10 each Monster walks with G tband at knees, 10 forward and back SLS on airex, stride ups x 10 with each leg, no UE support SLS bending to touch target 14" from floor. Lots of VC for technique, 12 reps each  07/16/23 NuStep L5 x 6 minutes Cobra pose, push up as high as possible while still keeping hips in contact with the mat, 2 x 30 sec. Child's pose, very tight Cat/Cow with limited motion noted. Quadruped alternating arm reach, leg reach, bird dog x 4 each side. Quadruped thread the needle x 3 each direction. Seated hs stretch with alternating leg on mat. Seated piriformis stretch with leg on mat, 3 x 15 sec on each side.  07/09/23 NuStep L5 x 6 minutes Massage gun to R piriformis Supine 3 way hip stretch with strap Seated HS stretch Standing add stretch Sit to stand while standing on Airex pad with G  tband at knees, OHP with 4# ball at the top of the stand x 10, repeat with chest press x 10 BOSU ball, upside down, static standing, lat weight shifts, ant/post weight shifts, then circles in each direction- impressive control. Alternating stp ups onto 6" box on top of Airex, 5 reps each leg.  07/02/23 NuStep L5 x 6 minutes. Soft tissue assessment in R hip, mild tightness in piriformis, HS tight, no TTP LE stretch for piriformis, HS, hip musculature with strap. 2 x 15 sec each RLE.  Seated clamshells against G tband resistance, 2 x 10 B side stepping  against G Tband resistance, 3 x 5 steps each way. Mini squats, 2 x 10 reps  06/19/23 NuStep L5 x 6 mins Open/close gate red t-band 2x10 Prone extension stretch with added overpressure Prone hip ext 2x15 R pirifromis deep massage with theragun Ball squeeze and bridge 2x10 Trunk rotation 2x10 SLR with R 3Lb 2x10 LE stretching/PROM  EVAL 06/12/23   PATIENT EDUCATION:  Education details: HEP, POC, piriformis syndrome and sciatica  Person educated: Patient Education method: Explanation Education comprehension: verbalized understanding  HOME EXERCISE PROGRAM: Access Code: [email or web address] URL: [email or web address] Date: 06/12/2023 Prepared by: Cassie Freer  Exercises - Supine Piriformis Stretch  - 1 x daily - 7 x weekly - 2 sets - 10 reps - Supine Sciatic Nerve Glide  - 1 x daily - 7 x weekly - 2 sets - 10 reps - Supine Hamstring Stretch with Strap  - 1 x daily - 7 x weekly - 2 reps - 30 hold - Gastroc Stretch on Wall  - 1 x daily - 7 x weekly - 1 sets - 2 reps - 30 hold - Seated Hamstring Stretch  - 1 x daily - 7 x weekly - 1 sets - 2 reps - 30 hold - Seated Piriformis Stretch with Trunk Bend  - 1 x daily - 7 x weekly - 1 sets - 2 reps - 30 hold  ASSESSMENT:  CLINICAL IMPRESSION: Patient reports continued improvement in his symptoms, with decreased frequency. Still has some tightness in hamstrings and piriformis. Reports stretching has been helping and feels good. Feels that he is where at a good point and will be ready for d/c next visit.    OBJECTIVE IMPAIRMENTS: Abnormal gait, difficulty walking, decreased ROM, hypomobility, and pain.   REHAB POTENTIAL: Good  CLINICAL DECISION MAKING: Stable/uncomplicated  EVALUATION COMPLEXITY: Low   GOALS: Goals reviewed with patient? Yes  SHORT TERM GOALS: Target date: 07/17/23  Patient will be independent with initial HEP.  Goal status: 07/02/23- Met   LONG TERM GOALS: Target date: 08/01/23  Patient will be independent  with advanced/ongoing HEP to improve outcomes and carryover.  Goal status: INITIAL  2.  Patient will decrease numbness and tingling in RLE at least 50%  Goal status: 07/23/23- at least 40% improved, ongoing, MET 08/09/23  3.  Patient will demonstrate full pain free lumbar ROM to perform ADLs.   Baseline: see chart above Goal status: 07/23/23 met  4.  Patient will demonstrate improved flexibility in hamstrings and piriformis Baseline: visible shaking and some pain with stretching  Goal status: 07/09/23-HS remain severely tight. ongoing   PLAN:  PT FREQUENCY: 1x/week  PT DURATION: 10 weeks  PLANNED INTERVENTIONS: Therapeutic exercises, Therapeutic activity, Neuromuscular re-education, Balance training, Gait training, Patient/Family education, Self Care, Joint mobilization, Dry Needling, Electrical stimulation, Spinal manipulation, Spinal mobilization, Cryotherapy, Moist heat, Traction, Ionotophoresis 4mg /ml Dexamethasone, and Manual therapy.  PLAN FOR NEXT SESSION: continue with  stretching, might benefit from traction    Cassie Freer, DPT 08/15/2023, 2:09 PM

## 2023-08-16 ENCOUNTER — Ambulatory Visit: Payer: BC Managed Care – PPO

## 2023-08-16 DIAGNOSIS — M5416 Radiculopathy, lumbar region: Secondary | ICD-10-CM | POA: Diagnosis not present

## 2023-08-16 DIAGNOSIS — G5701 Lesion of sciatic nerve, right lower limb: Secondary | ICD-10-CM

## 2023-09-24 ENCOUNTER — Encounter: Payer: Self-pay | Admitting: Internal Medicine

## 2023-09-24 ENCOUNTER — Ambulatory Visit: Payer: BC Managed Care – PPO | Admitting: Internal Medicine

## 2023-09-24 ENCOUNTER — Other Ambulatory Visit: Payer: Self-pay

## 2023-09-24 ENCOUNTER — Ambulatory Visit (INDEPENDENT_AMBULATORY_CARE_PROVIDER_SITE_OTHER): Payer: BC Managed Care – PPO | Admitting: Internal Medicine

## 2023-09-24 VITALS — BP 112/74 | HR 91 | Temp 98.3°F | Resp 18 | Ht 71.0 in | Wt 195.5 lb

## 2023-09-24 DIAGNOSIS — J302 Other seasonal allergic rhinitis: Secondary | ICD-10-CM | POA: Diagnosis not present

## 2023-09-24 DIAGNOSIS — J3089 Other allergic rhinitis: Secondary | ICD-10-CM

## 2023-09-24 DIAGNOSIS — J329 Chronic sinusitis, unspecified: Secondary | ICD-10-CM

## 2023-09-24 DIAGNOSIS — J4530 Mild persistent asthma, uncomplicated: Secondary | ICD-10-CM

## 2023-09-24 MED ORDER — PULMICORT FLEXHALER 90 MCG/ACT IN AEPB: INHALATION_SPRAY | RESPIRATORY_TRACT | 3 refills | Status: DC

## 2023-09-24 MED ORDER — ALBUTEROL SULFATE HFA 108 (90 BASE) MCG/ACT IN AERS: 2.0000 | INHALATION_SPRAY | Freq: Four times a day (QID) | RESPIRATORY_TRACT | 1 refills | Status: DC | PRN

## 2023-09-24 NOTE — Patient Instructions (Addendum)
Seasonal and perennial allergic rhinitis: - allergen avoidance towards grasses, weeds, trees, indoor/outdoor molds, dust mites, cat, dogs, mixed feathers, cockroach, mouse - consider allergy shots as long term control of your symptoms by teaching your immune system to be more tolerant of your allergy triggers - Continue Flonase 1-2 sprays daily as needed. - Continue over the counter antihistamine daily or daily as needed.   -Your options include Zyrtec (Cetirizine) 10mg , Claritin (Loratadine) 10mg , Allegra (Fexofenadine) 180mg , or Xyzal (Levocetirinze) 5mg   Recurrent sinusitis:  - immune labs were reassuring, no further immune evaluation indicated - continue to keep track of sinus infections and how often you are needing antibiotics, may consider sinus imaging in the future if this continues to be an issue  Mild Persistent Asthma: Breathing test looked great - During respiratory illness/asthma flares:  Start Pulmicort 90 mcg, 2 puffs twice a day; Use for at least 1-2 week or until symptoms resolve. - Rinse mouth out after use - Rescue Inhaler: Albuterol (Proair/Ventolin) 2 puffs . Use  every 4-6 hours as needed for chest tightness, wheezing, or coughing.  Can also use 15 minutes prior to exercise if you have symptoms with activity. - Asthma is not controlled if:  - Symptoms are occurring >2 times a week OR  - >2 times a month nighttime awakenings  - You are requiring systemic steroids (prednisone/steroid injections) more than once per year  - Your require hospitalization for your asthma.  - Please call the clinic to schedule a follow up if these symptoms arise  Almond Intolerance - avoidance is optional based on tolerance of symptoms  Follow-up in 12 months, sooner if needed. It was a pleasure seeing you again in clinic today!  Tonny Bollman, MD [facility]   Reducing Pollen Exposure  The American Academy of Allergy, Asthma and Immunology suggests the following  steps to reduce your exposure to pollen during allergy seasons.    Do not hang sheets or clothing out to dry; pollen may collect on these items. Do not mow lawns or spend time around freshly cut grass; mowing stirs up pollen. Keep windows closed at night.  Keep car windows closed while driving. Minimize morning activities outdoors, a time when pollen counts are usually at their highest. Stay indoors as much as possible when pollen counts or humidity is high and on windy days when pollen tends to remain in the air longer. Use air conditioning when possible.  Many air conditioners have filters that trap the pollen spores. Use a HEPA room air filter to remove pollen form the indoor air you breathe.  DUST MITE AVOIDANCE MEASURES:  There are three main measures that need and can be taken to avoid house dust mites:  Reduce accumulation of dust in general -reduce furniture, clothing, carpeting, books, stuffed animals, especially in bedroom  Separate yourself from the dust -use pillow and mattress encasements (can be found at stores such as [employer] or online) -avoid direct exposure to air condition flow -use a HEPA filter device, especially in the bedroom; you can also use a HEPA filter vacuum cleaner -wipe dust with a moist towel instead of a dry towel or broom when cleaning  Decrease mites and/or their secretions -wash clothing and linen and stuffed animals at highest temperature possible, at least every 2 weeks -stuffed animals can also be placed in a bag and put in a freezer overnight  Despite the above measures, it is impossible to eliminate dust mites or their allergen  completely from your home.  With the above measures the burden of mites in your home can be diminished, with the goal of minimizing your allergic symptoms.  Success will be reached only when implementing and using all means together.  Control of Dog or Cat Allergen  Avoidance is the best way to manage a dog  or cat allergy. If you have a dog or cat and are allergic to dog or cats, consider removing the dog or cat from the home. If you have a dog or cat but don't want to find it a new home, or if your family wants a pet even though someone in the household is allergic, here are some strategies that may help keep symptoms at bay:  Keep the pet out of your bedroom and restrict it to only a few rooms. Be advised that keeping the dog or cat in only one room will not limit the allergens to that room. Don't pet, hug or kiss the dog or cat; if you do, wash your hands with soap and water. High-efficiency particulate air (HEPA) cleaners run continuously in a bedroom or living room can reduce allergen levels over time. Regular use of a high-efficiency vacuum cleaner or a central vacuum can reduce allergen levels. Giving your dog or cat a bath at least once a week can reduce airborne allergen.  Control of Cockroach Allergen  Cockroach allergen has been identified as an important cause of acute attacks of asthma, especially in urban settings.  There are fifty-five species of cockroach that exist in the [place], however only three, the [place], [place] species produce allergen that can affect patients with Asthma.  Allergens can be obtained from fecal particles, egg casings and secretions from cockroaches.    Remove food sources. Reduce access to water. Seal access and entry points. Spray runways with 0.5-1% Diazinon or Chlorpyrifos Blow boric acid power under stoves and refrigerator. Place bait stations (hydramethylnon) at feeding sites.

## 2023-09-24 NOTE — Progress Notes (Signed)
FOLLOW UP Date of Service/Encounter:  09/24/23  Subjective:  Larry Brooks (DOB: 05-16-1976) is a 47 y.o. male who [facility] in re-evaluation of the following: seasonal and perennial allergic rhinitis, recurrent sinusitis, mild persistent asthma History obtained from: chart review and patient.  For Review, LV was on 03/20/23  with Dr.Kaavya Puskarich seen for routine follow-up. See below for summary of history and diagnostics.   Therapeutic plans/changes recommended: FEV1 81%, doing well-no changes. ----------------------------------------------------- Pertinent History/Diagnostics:  Asthma:  mild persistent, active during respiratory illness  - pre/post spirometry (12/22/21) while asymptomatic: ratio 100%, 75% FEV1 (pre), 75% FEV1 (post)-nonobstructive, no improvement with bronchodilator, scooping noted on expiratory loop Allergic Rhinitis:  - SPT environmental panel (12/22/21): positive to grasses, weeds, trees, indoor/outdoor molds, dust mites, cat, dogs, mixed feathers, cockroach, mouse  - No interest in AIT Food Intolerance (almonds)  - Hx of reaction: -gets mouth sores if eats too many, so avoids large quantities - SPT select foods (almond): 2 x 2 (same as negative control)  - labs: igE almond < 0.10  Recurrent sinusitis:  Getting antibiotics 3-4 times per year-usually for sinus infection that goes to bronchitis, pneumonia twice - hospitalized in High School, No history of skin infection.  No recurrent ear infections.  No hospitalizations outside of high school.   Increased number of infections in the past 2-3 years (2020-2023)   - immune evaluation labs: CH50, IgM, IgA, IgG, CBCd, diphteria, tetanus and strep titers all within normal limits - no previous sinus imaging, last visit with ENT when he was a small child --------------------------------------------------- Today presents for follow-up. Discussed the use of AI scribe software for  clinical note transcription with the patient, who gave verbal consent to proceed.  History of Present Illness   The patient, with a history of asthma and allergies, reports a significant improvement in his condition. He has not experienced any asthma flares, has not needed to use albuterol, and has not required any courses of prednisone or antibiotics. He was previously on Armonair, but this has been switched to Pulmicort, which he has on hand but has not needed to use. His allergies are well-controlled with Zyrtec.  In addition to his medical conditions, the patient has been trying yoga to improve joint flexibility and overall health. He reports some difficulty with certain postures and slower, deeper breathing techniques, but overall finds it beneficial.      All medications reviewed by clinical staff and updated in chart. No new pertinent medical or surgical history except as noted in HPI.  ROS: All others negative except as noted per HPI.   Objective:  BP 112/74 (BP Location: Right Arm, Patient Position: Sitting, Cuff Size: Normal)   Pulse 91   Temp 98.3 F (36.8 C) (Temporal)   Resp 18   Ht 5\' 11"  (1.803 m)   Wt 195 lb 8 oz (88.7 kg)   SpO2 98%   BMI 27.27 kg/m  Body mass index is 27.27 kg/m. Physical Exam: General Appearance:  Alert, cooperative, no distress, appears stated age  Head:  Normocephalic, without obvious abnormality, atraumatic  Eyes:  Conjunctiva clear, EOM's intact  Ears EACs normal bilaterally and normal TMs bilaterally  Nose: Nares normal, hypertrophic turbinates, normal mucosa, and no visible anterior polyps  Throat: Lips, tongue normal; teeth and gums normal, normal posterior oropharynx  Neck: Supple, symmetrical  Lungs:   clear to auscultation bilaterally, Respirations unlabored, no coughing  Heart:  regular rate and rhythm and no  murmur, Appears well perfused  Extremities: No edema  Skin: Skin color, texture, turgor normal and no rashes or lesions on  visualized portions of skin  Neurologic: No gross deficits   Labs:  Lab Orders  No laboratory test(s) ordered today    Spirometry:  Tracings reviewed. His effort: Good reproducible efforts. FVC: 4.24L FEV1: 3.33L, 80% predicted FEV1/FVC ratio: 0.79 Interpretation: Spirometry consistent with normal pattern.  Please see scanned spirometry results for details.  Assessment/Plan   Seasonal and perennial allergic rhinitis: at goal - allergen avoidance towards grasses, weeds, trees, indoor/outdoor molds, dust mites, cat, dogs, mixed feathers, cockroach, mouse - consider allergy shots as long term control of your symptoms by teaching your immune system to be more tolerant of your allergy triggers - Continue Flonase 1-2 sprays daily as needed. - Continue over the counter antihistamine daily or daily as needed.   -Your options include Zyrtec (Cetirizine) 10mg , Claritin (Loratadine) 10mg , Allegra (Fexofenadine) 180mg , or Xyzal (Levocetirinze) 5mg   Recurrent sinusitis: at goal - immune labs were reassuring, no further immune evaluation indicated - continue to keep track of sinus infections and how often you are needing antibiotics, may consider sinus imaging in the future if this continues to be an issue  Mild Persistent Asthma: at goal Stable with no recent flares, use of albuterol, or need for prednisone. Currently on Pulmicort as needed, but has not required it recently. Breathing test looked great - During respiratory illness/asthma flares:  Start Pulmicort 90 mcg, 2 puffs twice a day; Use for at least 1-2 week or until symptoms resolve. - Rinse mouth out after use - Rescue Inhaler: Albuterol (Proair/Ventolin) 2 puffs . Use  every 4-6 hours as needed for chest tightness, wheezing, or coughing.  Can also use 15 minutes prior to exercise if you have symptoms with activity. - Asthma is not controlled if:  - Symptoms are occurring >2 times a week OR  - >2 times a month nighttime  awakenings  - You are requiring systemic steroids (prednisone/steroid injections) more than once per year  - Your require hospitalization for your asthma.  - Please call the clinic to schedule a follow up if these symptoms arise  Almond Intolerance-stable - avoidance is optional based on tolerance of symptoms  Follow-up in 12 months, sooner if needed. It was a pleasure seeing you again in clinic today!  Other: none  Tonny Bollman, MD  [facility]

## 2024-03-21 ENCOUNTER — Encounter: Payer: Self-pay | Admitting: *Deleted

## 2024-03-21 ENCOUNTER — Other Ambulatory Visit: Payer: Self-pay

## 2024-03-21 ENCOUNTER — Ambulatory Visit
Admission: EM | Admit: 2024-03-21 | Discharge: 2024-03-21 | Disposition: A | Discharge status: Home / Self Care | Attending: Emergency Medicine | Admitting: Emergency Medicine

## 2024-03-21 DIAGNOSIS — L03312 Cellulitis of back [any part except buttock]: Secondary | ICD-10-CM | POA: Diagnosis not present

## 2024-03-21 MED ORDER — AMOXICILLIN-POT CLAVULANATE 875-125 MG PO TABS: 1.0000 | ORAL_TABLET | Freq: Two times a day (BID) | ORAL | 0 refills | Status: DC

## 2024-03-21 NOTE — ED Triage Notes (Signed)
 PT reports possible insect bite occurred on WED. Pt has pictures of site.

## 2024-03-21 NOTE — Discharge Instructions (Addendum)
 Take the antibiotics twice daily for the next 7 days.  You can take these with food to help prevent stomach upset.  Continue to take pictures and track the healing progress of the wound.  The redness, erythema and borders should improve over the next 72 hours on antibiotics.  The wound may have been shingles initially, and then got infected.  Please keep the blistered area covered until it is fully healed.  Follow-up with your primary care provider or return to clinic if no improvement or any changes.

## 2024-03-21 NOTE — ED Provider Notes (Signed)
 [facility]    CSN: [id number] Arrival date & time: 03/21/24  1833      History   Chief Complaint Chief Complaint  Patient presents with   Insect Bite    HPI Larry Brooks is a 48 y.o. male.   Patient presents to clinic over concern of an itchy erythematous rash to the right scapular area.  Initially noticed the itchy rash on Wednesday.  He does not go outside.  Reports he has been inside recently due to dealing with bronchitis.  He did have chickenpox when he was a child, has never had shingles.  Denies any pain at the rash site.  Has been taking pictures and tracking progress, initially started out as a few blistering lesions and now they have spread, gotten warm, swollen and erythematous.  Has not had any fevers.  For bronchitis provider had sent in cough medicine and albuterol  inhaler.  Patient is hesitant to try steroids.  The history is provided by the patient and medical records.    Past Medical History:  Diagnosis Date   Childhood asthma    Generalized anxiety disorder    Leukopenia    Thrombocytopenia (HCC)     Patient Active Problem List   Diagnosis Date Noted   Abnormal LFTs 07/22/2023   COVID-19 long hauler manifesting chronic cough 01/30/2023   Labral tear of shoulder, degenerative, right 07/17/2022   Seasonal and perennial allergic rhinitis 03/27/2022   Adverse food reaction 03/27/2022   Mild persistent asthma 03/27/2022   Recurrent sinusitis 03/27/2022   Allergies 11/09/2021   Preventative health care 07/15/2021   Allergic rhinitis 10/31/2010   GENERALIZED ANXIETY DISORDER 12/08/2008   HYPERLIPIDEMIA 12/19/2007    Past Surgical History:  Procedure Laterality Date   TONSILLECTOMY     WISDOM TOOTH EXTRACTION         Home Medications    Prior to Admission medications   Medication Sig Start Date End Date Taking? Authorizing Provider  albuterol  (VENTOLIN  HFA) 108 (90 Base) MCG/ACT inhaler Inhale 2 puffs into the lungs every 6  (six) hours as needed for wheezing or shortness of breath. 09/24/23  Yes Sean Czar, MD  amoxicillin -clavulanate (AUGMENTIN ) 875-125 MG tablet Take 1 tablet by mouth every 12 (twelve) hours. 03/21/24  Yes Roselani Grajeda  N, FNP  cetirizine (ZYRTEC) 10 MG tablet Take 10 mg by mouth daily.   Yes [provider]  ipratropium (ATROVENT ) 0.03 % nasal spray Place 2 sprays into both nostrils 3 (three) times daily as needed for rhinitis. 12/22/21  Yes Sean Czar, MD  Probiotic Product (PROBIOTIC ADVANCED PO) Take 3 capsules by mouth daily.   Yes [provider]    Family History Family History  Problem Relation Age of Onset   Coronary artery disease Father        status post 4 stents    Cancer Father        prostate   Hyperlipidemia Father    COPD Maternal Grandmother    Cancer Maternal Grandfather        unsure type of cancer   Cancer Paternal Grandmother        some male cancer?   Heart attack Paternal Grandfather        age 56   Hyperlipidemia Paternal Grandfather    Allergic rhinitis Son    Food Allergy Son        dairy, peanut   Food Allergy Son        peanut   Allergic rhinitis  Son    Diabetes type II Other        grandfather   Hyperlipidemia Other        grandfather   Colon cancer Neg Hx    Asthma Neg Hx    Immunodeficiency Neg Hx    Angioedema Neg Hx    Atopy Neg Hx    Eczema Neg Hx    Urticaria Neg Hx     Social History Social History   Tobacco Use   Smoking status: Never    Passive exposure: Never   Smokeless tobacco: Never   Tobacco comments:    never used tobacco  Vaping Use   Vaping status: Never Used  Substance Use Topics   Alcohol use: Yes    Comment: very rare-social   Drug use: No     Allergies   Patient has no known allergies.   Review of Systems Review of Systems  Per HPI  Physical Exam Triage Vital Signs ED Triage Vitals  Encounter Vitals Group     BP 03/21/24 1841 116/74     Systolic BP Percentile --       Diastolic BP Percentile --      Pulse Rate 03/21/24 1841 87     Resp --      Temp 03/21/24 1841 97.7 F (36.5 C)     Temp src --      SpO2 03/21/24 1841 96 %     Weight --      Height --      Head Circumference --      Peak Flow --      Pain Score 03/21/24 1839 0     Pain Loc --      Pain Education --      Exclude from Growth Chart --    No data found.  Updated Vital Signs BP 116/74   Pulse 87   Temp 97.7 F (36.5 C)   SpO2 96%   Visual Acuity Right Eye Distance:   Left Eye Distance:   Bilateral Distance:    Right Eye Near:   Left Eye Near:    Bilateral Near:     Physical Exam Vitals and nursing note reviewed.  Constitutional:      Appearance: Normal appearance.  HENT:     Head: Normocephalic and atraumatic.     Right Ear: External ear normal.     Left Ear: External ear normal.     Nose: Nose normal.     Mouth/Throat:     Mouth: Mucous membranes are moist.  Eyes:     Conjunctiva/sclera: Conjunctivae normal.  Cardiovascular:     Rate and Rhythm: Normal rate.  Pulmonary:     Effort: Pulmonary effort is normal. No respiratory distress.  Skin:    General: Skin is warm and dry.     Findings: Rash present.       Neurological:     General: No focal deficit present.     Mental Status: He is alert.  Psychiatric:        Mood and Affect: Mood normal.        Behavior: Behavior is cooperative.      UC Treatments / Results  Labs (all labs ordered are listed, but only abnormal results are displayed) Labs Reviewed - No data to display  EKG   Radiology No results found.  Procedures Procedures (including critical care time)  Medications Ordered in UC Medications - No data to display  Initial Impression / Assessment and  Plan / UC Course  I have reviewed the triage vital signs and the nursing notes.  Pertinent labs & imaging results that were available during my care of the patient were reviewed by me and considered in my medical decision making  (see chart for details).  No signs or reviewed, patient is hemodynamically stable.  Rash consistent with cellulitis to the right posterior scapular area.  May have initially been shingles, unclear.  Patient continues to cough with bronchitis.  Offered IM steroid to help with inflammation and bronchitis, patient declined as he would like to stick as close to functional medicine as possible.  Augmentin  sent in for cellulitis.  Plan of care, follow-up care return precautions given, no questions at this time.     Final Clinical Impressions(s) / UC Diagnoses   Final diagnoses:  Cellulitis of back except buttock     Discharge Instructions      Take the antibiotics twice daily for the next 7 days.  You can take these with food to help prevent stomach upset.  Continue to take pictures and track the healing progress of the wound.  The redness, erythema and borders should improve over the next 72 hours on antibiotics.  The wound may have been shingles initially, and then got infected.  Please keep the blistered area covered until it is fully healed.  Follow-up with your primary care provider or return to clinic if no improvement or any changes.    ED Prescriptions     Medication Sig Dispense Auth. Provider   amoxicillin -clavulanate (AUGMENTIN ) 875-125 MG tablet Take 1 tablet by mouth every 12 (twelve) hours. 14 tablet Harlow Lighter, Kimoni Pickerill  N, FNP      PDMP not reviewed this encounter.   Celestine Colder, FNP 03/21/24 1905

## 2024-04-09 ENCOUNTER — Encounter: Payer: Self-pay | Admitting: Family Medicine

## 2024-04-09 ENCOUNTER — Other Ambulatory Visit: Payer: Self-pay | Admitting: Family Medicine

## 2024-04-09 ENCOUNTER — Ambulatory Visit
Admission: RE | Admit: 2024-04-09 | Discharge: 2024-04-09 | Disposition: A | Discharge status: Home / Self Care | Source: Ambulatory Visit | Attending: Family Medicine | Admitting: Family Medicine

## 2024-04-09 DIAGNOSIS — R053 Chronic cough: Secondary | ICD-10-CM

## 2024-04-09 DIAGNOSIS — R059 Cough, unspecified: Secondary | ICD-10-CM | POA: Diagnosis not present

## 2024-05-12 ENCOUNTER — Ambulatory Visit (INDEPENDENT_AMBULATORY_CARE_PROVIDER_SITE_OTHER): Admitting: Internal Medicine

## 2024-05-12 ENCOUNTER — Other Ambulatory Visit: Payer: Self-pay

## 2024-05-12 ENCOUNTER — Encounter: Payer: Self-pay | Admitting: Internal Medicine

## 2024-05-12 ENCOUNTER — Other Ambulatory Visit: Payer: Self-pay | Admitting: Internal Medicine

## 2024-05-12 VITALS — BP 130/84 | HR 74 | Temp 98.2°F | Resp 16 | Ht 70.5 in | Wt 200.5 lb

## 2024-05-12 DIAGNOSIS — J4530 Mild persistent asthma, uncomplicated: Secondary | ICD-10-CM

## 2024-05-12 DIAGNOSIS — J302 Other seasonal allergic rhinitis: Secondary | ICD-10-CM

## 2024-05-12 DIAGNOSIS — T781XXD Other adverse food reactions, not elsewhere classified, subsequent encounter: Secondary | ICD-10-CM

## 2024-05-12 DIAGNOSIS — J3089 Other allergic rhinitis: Secondary | ICD-10-CM

## 2024-05-12 DIAGNOSIS — J329 Chronic sinusitis, unspecified: Secondary | ICD-10-CM

## 2024-05-12 MED ORDER — PULMICORT FLEXHALER 180 MCG/ACT IN AEPB: INHALATION_SPRAY | RESPIRATORY_TRACT | 5 refills | Status: AC

## 2024-05-12 MED ORDER — ALBUTEROL SULFATE HFA 108 (90 BASE) MCG/ACT IN AERS: 2.0000 | INHALATION_SPRAY | Freq: Four times a day (QID) | RESPIRATORY_TRACT | 1 refills | Status: DC | PRN

## 2024-05-12 MED ORDER — MONTELUKAST SODIUM 10 MG PO TABS: 10.0000 mg | ORAL_TABLET | Freq: Every day | ORAL | 5 refills | Status: DC

## 2024-05-12 NOTE — Addendum Note (Signed)
 Addended by: Norville Beery, Jaja Switalski on: 05/12/2024 05:15 PM   Modules accepted: Orders

## 2024-05-12 NOTE — Patient Instructions (Addendum)
 Seasonal and perennial allergic rhinitis: - allergen avoidance towards grasses, weeds, trees, indoor/outdoor molds, dust mites, cat, dogs, mixed feathers, cockroach, mouse - consider allergy shots as long term control of your symptoms by teaching your immune system to be more tolerant of your allergy triggers - Continue Flonase  1-2 sprays daily as needed. - Continue over the counter antihistamine daily or daily as needed.   -Your options include Zyrtec (Cetirizine) 10mg , Claritin (Loratadine) 10mg , Allegra (Fexofenadine) 180mg , or Xyzal (Levocetirinze) 5mg  -continue singulair 10 mg nightly-stop if nightmares or behavior changes.  Recurrent sinusitis:  - immune labs were reassuring, no further immune evaluation indicated - continue to keep track of sinus infections and how often you are needing antibiotics, may consider sinus imaging in the future if this continues to be an issue  Mild Persistent Asthma: Breathing test showed restriction - During respiratory illness/asthma flares:  Continue Pulmicort  90 mcg, 2 puffs twice a day; Use for at least 1-2 week or until symptoms resolve. - Rinse mouth out after use - Rescue Inhaler: Albuterol  (Proair /Ventolin ) 2 puffs . Use  every 4-6 hours as needed for chest tightness, wheezing, or coughing.  Can also use 15 minutes prior to exercise if you have symptoms with activity. - Asthma is not controlled if:  - Symptoms are occurring >2 times a week OR  - >2 times a month nighttime awakenings  - You are requiring systemic steroids (prednisone /steroid injections) more than once per year  - Your require hospitalization for your asthma.  - Please call the clinic to schedule a follow up if these symptoms arise  Almond Intolerance - avoidance is optional based on tolerance of symptoms  Follow-up in June  27th, 1:30 PM in our [place] office (1-55) to discuss allergy injections. Must be off antihistamines 3 days prior to this visit. It was a pleasure seeing you  again in clinic today!  Jonathon Neighbors, MD [facility]  Reducing Pollen Exposure  The American Academy of Allergy, Asthma and Immunology suggests the following steps to reduce your exposure to pollen during allergy seasons.    Do not hang sheets or clothing out to dry; pollen may collect on these items. Do not mow lawns or spend time around freshly cut grass; mowing stirs up pollen. Keep windows closed at night.  Keep car windows closed while driving. Minimize morning activities outdoors, a time when pollen counts are usually at their highest. Stay indoors as much as possible when pollen counts or humidity is high and on windy days when pollen tends to remain in the air longer. Use air conditioning when possible.  Many air conditioners have filters that trap the pollen spores. Use a HEPA room air filter to remove pollen form the indoor air you breathe.  DUST MITE AVOIDANCE MEASURES:  There are three main measures that need and can be taken to avoid house dust mites:  Reduce accumulation of dust in general -reduce furniture, clothing, carpeting, books, stuffed animals, especially in bedroom  Separate yourself from the dust -use pillow and mattress encasements (can be found at stores such as [employer] or online) -avoid direct exposure to air condition flow -use a HEPA filter device, especially in the bedroom; you can also use a HEPA filter vacuum cleaner -wipe dust with a moist towel instead of a dry towel or broom when cleaning  Decrease mites and/or their secretions -wash clothing and linen and stuffed animals at highest temperature possible, at least every 2 weeks -stuffed  animals can also be placed in a bag and put in a freezer overnight  Despite the above measures, it is impossible to eliminate dust mites or their allergen completely from your home.  With the above measures the burden of mites in your home can be diminished, with the goal of  minimizing your allergic symptoms.  Success will be reached only when implementing and using all means together.  Control of Dog or Cat Allergen  Avoidance is the best way to manage a dog or cat allergy. If you have a dog or cat and are allergic to dog or cats, consider removing the dog or cat from the home. If you have a dog or cat but don't want to find it a new home, or if your family wants a pet even though someone in the household is allergic, here are some strategies that may help keep symptoms at bay:  Keep the pet out of your bedroom and restrict it to only a few rooms. Be advised that keeping the dog or cat in only one room will not limit the allergens to that room. Don't pet, hug or kiss the dog or cat; if you do, wash your hands with soap and water. High-efficiency particulate air (HEPA) cleaners run continuously in a bedroom or living room can reduce allergen levels over time. Regular use of a high-efficiency vacuum cleaner or a central vacuum can reduce allergen levels. Giving your dog or cat a bath at least once a week can reduce airborne allergen.  Control of Cockroach Allergen  Cockroach allergen has been identified as an important cause of acute attacks of asthma, especially in urban settings.  There are fifty-five species of cockroach that exist in the [place] , however only three, the [place], [place] and [place] species produce allergen that can affect patients with Asthma.  Allergens can be obtained from fecal particles, egg casings and secretions from cockroaches.    Remove food sources. Reduce access to water. Seal access and entry points. Spray runways with 0.5-1% Diazinon or Chlorpyrifos Blow boric acid power under stoves and refrigerator. Place bait stations (hydramethylnon) at feeding sites.

## 2024-05-12 NOTE — Progress Notes (Signed)
 FOLLOW UP Date of Service/Encounter:   05/12/2024  Subjective:  Larry Brooks (DOB: 1976-06-02) is a 48 y.o. male who [facility] in re-evaluation of the following: Asthma, allergic rhinitis, recurrent sinusitis History obtained from: chart review and patient.  For Review, LV was on 09/24/23  with Dr.Dayrin Stallone seen for routine follow-up. See below for summary of history and diagnostics.   Therapeutic plans/changes recommended: Doing well at that visit, reported doing yoga to help with joint flexibility.  Breathing techniques, FEV1 80%.  Controlled with Pulmicort  inhaler in block therapy ----------------------------------------------------- Pertinent History/Diagnostics:  Asthma:  mild persistent, active during respiratory illness  - pre/post spirometry (12/22/21) while asymptomatic: ratio 100%, 75% FEV1 (pre), 75% FEV1 (post)-nonobstructive, no improvement with bronchodilator, scooping noted on expiratory loop Allergic Rhinitis:  - SPT environmental panel (12/22/21): positive to grasses, weeds, trees, indoor/outdoor molds, dust mites, cat, dogs, mixed feathers, cockroach, mouse  - No interest in AIT Food Intolerance (almonds)  - Hx of reaction: -gets mouth sores if eats too many, so avoids large quantities - SPT select foods (almond): 2 x 2 (same as negative control)  - labs: igE almond < 0.10  Recurrent sinusitis:  Getting antibiotics 3-4 times per year-usually for sinus infection that goes to bronchitis, pneumonia twice - hospitalized in High School, No history of skin infection.  No recurrent ear infections.  No hospitalizations outside of high school.   Increased number of infections in the past 2-3 years (2020-2023)   - immune evaluation labs: CH50, IgM, IgA, IgG, CBCd, diphteria, tetanus and strep titers all within normal limits - no previous sinus imaging, last visit with ENT when he was a small  child --------------------------------------------------- Today presents for follow-up. Discussed the use of AI scribe software for clinical note transcription with the patient, who gave verbal consent to proceed.  History of Present Illness   Larry Brooks is a 48 year old male who presents for consideration of allergy shots.  He has been experiencing coughing and various symptoms over the past two months, which he attributes to allergies. These symptoms follow a regular pattern, occurring in the first quarter of the year. He has a history of multiple allergies.  Previous allergy testing was conducted in January 2023.  He is currently taking Singulair, which he finds effective, and has stopped taking Zyrtec due to dryness and increased coughing. He uses Pulmicort  as needed, particularly during flares, and has not required it in the past four to five weeks. He has taken antibiotics and Valtrex for a shingles outbreak on his side, but none for sinus infection since last visit.  He experiences a persistent cough, described as 'an allergy cough,' which is not severe but occurs occasionally. His symptoms have improved, allowing him to sleep better, which was previously a significant issue.  He would like to consider allergy injections.     All medications reviewed by clinical staff and updated in chart. No new pertinent medical or surgical history except as noted in HPI.  ROS: All others negative except as noted per HPI.   Objective:  BP 130/84 (BP Location: Right Arm, Patient Position: Sitting, Cuff Size: Normal)   Pulse 74   Temp 98.2 F (36.8 C) (Temporal)   Resp 16   Ht 5' 10.5 (1.791 m)   Wt 200 lb 8 oz (90.9 kg)   SpO2 99%   BMI 28.36 kg/m  Body mass index is 28.36 kg/m. Physical Exam: General Appearance:  Alert, cooperative, no  distress, appears stated age  Head:  Normocephalic, without obvious abnormality, atraumatic  Eyes:  Conjunctiva clear, EOM's intact  Ears EACs  normal bilaterally and normal TMs bilaterally  Nose: Nares normal, hypertrophic turbinates, normal mucosa, and no visible anterior polyps  Throat: Lips, tongue normal; teeth and gums normal, normal posterior oropharynx  Neck: Supple, symmetrical  Lungs:   clear to auscultation bilaterally, Respirations unlabored, no coughing  Heart:  regular rate and rhythm and no murmur, Appears well perfused  Extremities: No edema  Skin: Skin color, texture, turgor normal and no rashes or lesions on visualized portions of skin  Neurologic: No gross deficits   Labs:  Lab Orders  No laboratory test(s) ordered today    Spirometry:  Tracings reviewed. His effort: It was hard to get consistent efforts and there is a question as to whether this reflects a maximal maneuver. FVC: 3.78L FEV1: 3.01L, 72% predicted FEV1/FVC ratio: 0.80 Interpretation: Spirometry consistent with normal pattern.  Please see scanned spirometry results for details.  Assessment/Plan   Seasonal and perennial allergic rhinitis: Not at goal, plan to update allergy testing via skin testing and then start allergy injections.  He prefers traditional buildup. - allergen avoidance towards grasses, weeds, trees, indoor/outdoor molds, dust mites, cat, dogs, mixed feathers, cockroach, mouse - consider allergy shots as long term control of your symptoms by teaching your immune system to be more tolerant of your allergy triggers - Continue Flonase  1-2 sprays daily as needed. - Continue over the counter antihistamine daily or daily as needed.   -Your options include Zyrtec (Cetirizine) 10mg , Claritin (Loratadine) 10mg , Allegra (Fexofenadine) 180mg , or Xyzal (Levocetirinze) 5mg  -continue singulair 10 mg nightly-stop if nightmares or behavior changes.  Recurrent sinusitis: Stable, no infections since last visit - immune labs were reassuring, no further immune evaluation indicated - continue to keep track of sinus infections and how often you  are needing antibiotics, may consider sinus imaging in the future if this continues to be an issue  Mild Persistent Asthma: At goal Breathing test showed restriction, suspect related to effort - During respiratory illness/asthma flares:  Continue Pulmicort  90 mcg, 2 puffs twice a day; Use for at least 1-2 week or until symptoms resolve. - Rinse mouth out after use - Rescue Inhaler: Albuterol  (Proair /Ventolin ) 2 puffs . Use  every 4-6 hours as needed for chest tightness, wheezing, or coughing.  Can also use 15 minutes prior to exercise if you have symptoms with activity. - Asthma is not controlled if:  - Symptoms are occurring >2 times a week OR  - >2 times a month nighttime awakenings  - You are requiring systemic steroids (prednisone /steroid injections) more than once per year  - Your require hospitalization for your asthma.  - Please call the clinic to schedule a follow up if these symptoms arise  Almond Intolerance-stable - avoidance is optional based on tolerance of symptoms  Follow-up in June  27th, 1:30 PM in our [place] office (1-55) to discuss allergy injections. Must be off antihistamines 3 days prior to this visit. It was a pleasure seeing you again in clinic today!  Other: None  Jonathon Neighbors, MD  [facility] 

## 2024-05-23 ENCOUNTER — Ambulatory Visit (INDEPENDENT_AMBULATORY_CARE_PROVIDER_SITE_OTHER): Admitting: Internal Medicine

## 2024-05-23 ENCOUNTER — Encounter: Payer: Self-pay | Admitting: Internal Medicine

## 2024-05-23 DIAGNOSIS — J302 Other seasonal allergic rhinitis: Secondary | ICD-10-CM

## 2024-05-23 DIAGNOSIS — J3089 Other allergic rhinitis: Secondary | ICD-10-CM | POA: Diagnosis not present

## 2024-05-23 MED ORDER — EPINEPHRINE 0.3 MG/0.3ML IJ SOAJ: 0.3000 mg | INTRAMUSCULAR | 2 refills | Status: AC | PRN

## 2024-05-23 NOTE — Patient Instructions (Signed)
 Seasonal and perennial allergic rhinitis: - allergen avoidance towards grasses, weeds, trees, indoor/outdoor molds, dust mites, cat, dogs, mixed feathers, cockroach, mouse -05/23/24: similar results to prior; allergy Rx written based on both tests. - consider allergy shots as long term control of your symptoms by teaching your immune system to be more tolerant of your allergy triggers - Continue Flonase  1-2 sprays daily as needed. - Continue over the counter antihistamine daily or daily as needed.   -Your options include Zyrtec (Cetirizine) 10mg , Claritin (Loratadine) 10mg , Allegra (Fexofenadine) 180mg , or Xyzal (Levocetirinze) 5mg  -continue singulair 10 mg nightly-stop if nightmares or behavior changes.  Recurrent sinusitis:  - immune labs were reassuring, no further immune evaluation indicated - continue to keep track of sinus infections and how often you are needing antibiotics, may consider sinus imaging in the future if this continues to be an issue  Mild Persistent Asthma: Spirometry today: none - During respiratory illness/asthma flares:  Continue Pulmicort  90 mcg, 2 puffs twice a day; Use for at least 1-2 week or until symptoms resolve. - Rinse mouth out after use - Rescue Inhaler: Albuterol  (Proair /Ventolin ) 2 puffs . Use  every 4-6 hours as needed for chest tightness, wheezing, or coughing.  Can also use 15 minutes prior to exercise if you have symptoms with activity. - Asthma is not controlled if:  - Symptoms are occurring >2 times a week OR  - >2 times a month nighttime awakenings  - You are requiring systemic steroids (prednisone /steroid injections) more than once per year  - Your require hospitalization for your asthma.  - Please call the clinic to schedule a follow up if these symptoms arise  Almond Intolerance - avoidance is optional based on tolerance of symptoms  Follow-up in 6 months, sooner if needed Someone will reach out to schedule allergy injections. It was a  pleasure seeing you again in clinic today!  Larry Endow, MD [facility]  Reducing Pollen Exposure  The American Academy of Allergy, Asthma and Immunology suggests the following steps to reduce your exposure to pollen during allergy seasons.    Do not hang sheets or clothing out to dry; pollen may collect on these items. Do not mow lawns or spend time around freshly cut grass; mowing stirs up pollen. Keep windows closed at night.  Keep car windows closed while driving. Minimize morning activities outdoors, a time when pollen counts are usually at their highest. Stay indoors as much as possible when pollen counts or humidity is high and on windy days when pollen tends to remain in the air longer. Use air conditioning when possible.  Many air conditioners have filters that trap the pollen spores. Use a HEPA room air filter to remove pollen form the indoor air you breathe.  DUST MITE AVOIDANCE MEASURES:  There are three main measures that need and can be taken to avoid house dust mites:  Reduce accumulation of dust in general -reduce furniture, clothing, carpeting, books, stuffed animals, especially in bedroom  Separate yourself from the dust -use pillow and mattress encasements (can be found at stores such as [employer] or online) -avoid direct exposure to air condition flow -use a HEPA filter device, especially in the bedroom; you can also use a HEPA filter vacuum cleaner -wipe dust with a moist towel instead of a dry towel or broom when cleaning  Decrease mites and/or their secretions -wash clothing and linen and stuffed animals at highest temperature possible, at least every 2 weeks -stuffed  animals can also be placed in a bag and put in a freezer overnight  Despite the above measures, it is impossible to eliminate dust mites or their allergen completely from your home.  With the above measures the burden of mites in your home can be diminished, with  the goal of minimizing your allergic symptoms.  Success will be reached only when implementing and using all means together.  Control of Dog or Cat Allergen  Avoidance is the best way to manage a dog or cat allergy. If you have a dog or cat and are allergic to dog or cats, consider removing the dog or cat from the home. If you have a dog or cat but don't want to find it a new home, or if your family wants a pet even though someone in the household is allergic, here are some strategies that may help keep symptoms at bay:  Keep the pet out of your bedroom and restrict it to only a few rooms. Be advised that keeping the dog or cat in only one room will not limit the allergens to that room. Don't pet, hug or kiss the dog or cat; if you do, wash your hands with soap and water. High-efficiency particulate air (HEPA) cleaners run continuously in a bedroom or living room can reduce allergen levels over time. Regular use of a high-efficiency vacuum cleaner or a central vacuum can reduce allergen levels. Giving your dog or cat a bath at least once a week can reduce airborne allergen.  Control of Cockroach Allergen  Cockroach allergen has been identified as an important cause of acute attacks of asthma, especially in urban settings.  There are fifty-five species of cockroach that exist in the [place] , however only three, the [place], [place] and [place] species produce allergen that can affect patients with Asthma.  Allergens can be obtained from fecal particles, egg casings and secretions from cockroaches.    Remove food sources. Reduce access to water. Seal access and entry points. Spray runways with 0.5-1% Diazinon or Chlorpyrifos Blow boric acid power under stoves and refrigerator. Place bait stations (hydramethylnon) at feeding sites.

## 2024-05-23 NOTE — Progress Notes (Signed)
 Date of Service/Encounter:  05/23/24  Allergy testing appointment   Previous visit on 05/12/24, seen for mild persistent asthma, recurrent sinusitis, seasonal perennial allergic rhinitis, interested in allergy injections.  Please see that note for additional details.  Today reports for allergy diagnostic testing:    DIAGNOSTICS:  Skin Testing: Environmental allergy panel. Adequate positive and negative controls. Results discussed with patient/family.  Airborne Adult Perc - 05/23/24 1354     Time Antigen Placed 1354    Allergen Manufacturer Jestine    Location Back    Number of Test 55    1. Control-Buffer 50% Glycerol Negative    2. Control-Histamine 3+    3. Bahia Negative    4. [place] Negative    5. Johnson Negative    6. [place]  Blue 4+    7. Meadow Fescue 4+    8. Perennial Rye 4+    9. Timothy 4+    10. Ragweed Mix 3+    11. Cocklebur Negative    12. Plantain,  English Negative    13. Baccharis Negative    14. Dog Fennel Negative    15. Russian Thistle Negative    16. Lamb's Quarters Negative    17. Sheep Sorrell Negative    18. Rough Pigweed Negative    19. Marsh Elder, Rough Negative    20. Mugwort, Common Negative    21. Box, Elder Negative    22. Cedar, red Negative    23. Sweet Gum Negative    24. Pecan Pollen Negative    25. Pine Mix Negative    26. Walnut, Black Pollen Negative    27. Red Mulberry Negative    28. Ash Mix Negative    29. Birch Mix Negative    30. Beech American Negative    31. Cottonwood, [place] Negative    32. Hickory, White Negative    33. Maple Mix Negative    34. Oak, [place] Mix Negative    35. Sycamore Eastern Negative    36. Alternaria Alternata 4+    37. Cladosporium Herbarum Negative    38. Aspergillus Mix 2+    39. Penicillium Mix Negative    40. Bipolaris Sorokiniana (Helminthosporium) 4+    41. Drechslera Spicifera (Curvularia) 3+    42. Mucor Plumbeus Negative    43. Fusarium Moniliforme 4+    44. Aureobasidium  Pullulans (pullulara) Negative    45. Rhizopus Oryzae Negative    46. Botrytis Cinera 2+    47. Epicoccum Nigrum 2+    48. Phoma Betae Negative    49. Dust Mite Mix Negative    50. Cat Hair 10,000 BAU/ml Negative    51.  Dog Epithelia Negative    52. Mixed Feathers Negative    53. Horse Epithelia Negative    54. Cockroach, German Negative    55. Tobacco Leaf Negative          Intradermal - 05/23/24 1433     Time Antigen Placed 1433    Allergen Manufacturer Jestine    Location Arm    Number of Test 11    Control Negative    Bahia 3+    [place] 3+    Johnson Negative    Weed Mix 2+    Tree Mix 3+    Mite Mix 3+    Cat 2+    Dog Negative    Cockroach 2+          Allergy testing results were read and interpreted by myself, documented by  clinical staff.  Patient provided with copy of allergy testing along with avoidance measures when indicated.   Rocky Endow, MD  [facility] 

## 2024-05-26 NOTE — Progress Notes (Signed)
 Aeroallergen Immunotherapy  Ordering Provider: Dr. Rocky Endow  Patient Details Name: Kendrell Lottman MRN: [id number] Date of Birth: [birth year]  Order 2 of 2  Vial Label: M/DM/C/CR  0.2 ml (Volume)  1:20 Concentration -- Alternaria alternata 0.2 ml (Volume)  1:10 Concentration -- Aspergillus mix 0.2 ml (Volume)  1:20 Concentration -- Bipolaris sorokiniana 0.2 ml (Volume)  1:20 Concentration -- Drechslera spicifera 0.2 ml (Volume)  1:10 Concentration -- Fusarium moniliforme 0.2 ml (Volume)  1:40 Concentration -- Botrytis cinerea 0.2 ml (Volume)  1:40 Concentration -- Epicoccum nigrum 0.5 ml (Volume)  1:10 Concentration -- Cat Hair 0.3 ml (Volume)  1:20 Concentration -- Cockroach, German 0.5 ml (Volume)   AU Concentration -- Mite Mix (DF 5,000 & DP 5,000)   2.7  ml Extract Subtotal 2.3  ml Diluent 5.0  ml Maintenance Total  Schedule:  B Blue Vial (1:100,000): Schedule B (6 doses) Yellow Vial (1:10,000): Schedule B (6 doses) Green Vial (1:1,000): Schedule B (6 doses) Red Vial (1:100): Schedule A (14 doses)  Special Instructions: After completion of the first Red Vial, please space to every two weeks. After completion of the second Red Vial, please space to every 4 weeks. Ok to up dose new vials at 0.23mL --> 0.3 mL --> 0.5 mL. Ok to come twice weekly except in red vial, if desired, as long as there is 48 hours between injections

## 2024-05-26 NOTE — Progress Notes (Signed)
VIALS NOT MADE UNTIL APPT SCHED. 

## 2024-05-26 NOTE — Progress Notes (Signed)
 Aeroallergen Immunotherapy  Ordering Provider: Dr. Rocky Endow  Patient Details Name: Larry Brooks MRN: [id number] Date of Birth: 11/29/1975  Order 1 of 2  Vial Label: G/W/T/D  0.3 ml (Volume)  BAU Concentration -- 7 Grass Mix* 100,000 ([place]  Blue, [place], [place], Perennial Rye, RedTop, Sweet Vernal, Timothy) 0.2 ml (Volume)  1:20 Concentration -- Bahia 0.3 ml (Volume)  BAU Concentration -- [place] 10,000 0.3 ml (Volume)  1:20 Concentration -- Ragweed Mix 0.5 ml (Volume)  1:20 Concentration -- Weed Mix* 0.5 ml (Volume)  1:20 Concentration -- Eastern 10 Tree Mix (also Sweet Gum) 0.5 ml (Volume)  1:10 Concentration -- Dog Epithelia   2.6  ml Extract Subtotal 2.4  ml Diluent 5.0  ml Maintenance Total  Schedule:  B Blue Vial (1:100,000): Schedule B (6 doses) Yellow Vial (1:10,000): Schedule B (6 doses) Green Vial (1:1,000): Schedule B (6 doses) Red Vial (1:100): Schedule A (14 doses)  Special Instructions: After completion of the first Red Vial, please space to every two weeks. After completion of the second Red Vial, please space to every 4 weeks. Ok to up dose new vials at 0.29mL --> 0.3 mL --> 0.5 mL. Ok to come twice weekly except in red vial, if desired, as long as there is 48 hours between injections

## 2024-08-05 ENCOUNTER — Encounter: Payer: Self-pay | Admitting: Internal Medicine

## 2024-08-14 DIAGNOSIS — J3081 Allergic rhinitis due to animal (cat) (dog) hair and dander: Secondary | ICD-10-CM | POA: Diagnosis not present

## 2024-08-14 DIAGNOSIS — J301 Allergic rhinitis due to pollen: Secondary | ICD-10-CM | POA: Diagnosis not present

## 2024-08-14 DIAGNOSIS — J3089 Other allergic rhinitis: Secondary | ICD-10-CM | POA: Diagnosis not present

## 2024-08-14 DIAGNOSIS — J302 Other seasonal allergic rhinitis: Secondary | ICD-10-CM | POA: Diagnosis not present

## 2024-08-14 NOTE — Progress Notes (Signed)
 VIALS MADE 08-14-24

## 2024-08-29 ENCOUNTER — Ambulatory Visit (INDEPENDENT_AMBULATORY_CARE_PROVIDER_SITE_OTHER)

## 2024-08-29 DIAGNOSIS — J302 Other seasonal allergic rhinitis: Secondary | ICD-10-CM | POA: Diagnosis not present

## 2024-08-29 DIAGNOSIS — J3089 Other allergic rhinitis: Secondary | ICD-10-CM

## 2024-08-29 NOTE — Progress Notes (Signed)
 Immunotherapy   Patient Details  Name: Larry Brooks MRN: [id number] Date of Birth: 1975-12-22  08/29/2024  Larry Brooks started injections for  M-DM-Ct-CR and G-W-T-Dg Following schedule: B  Frequency:1 time per week Epi-Pen:Epi-Pen Available  Consent signed and patient instructions given.  No reaction after 30 min wait  [name] 08/29/2024, 3:14 PM

## 2024-09-05 ENCOUNTER — Ambulatory Visit (INDEPENDENT_AMBULATORY_CARE_PROVIDER_SITE_OTHER): Payer: Self-pay

## 2024-09-05 DIAGNOSIS — J302 Other seasonal allergic rhinitis: Secondary | ICD-10-CM | POA: Diagnosis not present

## 2024-09-05 DIAGNOSIS — J3089 Other allergic rhinitis: Secondary | ICD-10-CM

## 2024-09-12 ENCOUNTER — Ambulatory Visit (INDEPENDENT_AMBULATORY_CARE_PROVIDER_SITE_OTHER): Payer: Self-pay

## 2024-09-12 DIAGNOSIS — J302 Other seasonal allergic rhinitis: Secondary | ICD-10-CM | POA: Diagnosis not present

## 2024-09-12 DIAGNOSIS — J3089 Other allergic rhinitis: Secondary | ICD-10-CM | POA: Diagnosis not present

## 2024-09-19 ENCOUNTER — Ambulatory Visit (INDEPENDENT_AMBULATORY_CARE_PROVIDER_SITE_OTHER): Payer: Self-pay | Admitting: *Deleted

## 2024-09-19 DIAGNOSIS — J309 Allergic rhinitis, unspecified: Secondary | ICD-10-CM | POA: Diagnosis not present

## 2024-09-22 ENCOUNTER — Other Ambulatory Visit: Payer: Self-pay

## 2024-09-22 ENCOUNTER — Encounter: Payer: Self-pay | Admitting: Internal Medicine

## 2024-09-22 ENCOUNTER — Ambulatory Visit (INDEPENDENT_AMBULATORY_CARE_PROVIDER_SITE_OTHER): Payer: BC Managed Care – PPO | Admitting: Internal Medicine

## 2024-09-22 VITALS — BP 114/82 | HR 68 | Temp 98.3°F | Ht 71.0 in | Wt 198.3 lb

## 2024-09-22 DIAGNOSIS — T7819XD Other adverse food reactions, not elsewhere classified, subsequent encounter: Secondary | ICD-10-CM | POA: Diagnosis not present

## 2024-09-22 DIAGNOSIS — J3089 Other allergic rhinitis: Secondary | ICD-10-CM | POA: Diagnosis not present

## 2024-09-22 DIAGNOSIS — J329 Chronic sinusitis, unspecified: Secondary | ICD-10-CM

## 2024-09-22 DIAGNOSIS — J4530 Mild persistent asthma, uncomplicated: Secondary | ICD-10-CM | POA: Diagnosis not present

## 2024-09-22 DIAGNOSIS — J302 Other seasonal allergic rhinitis: Secondary | ICD-10-CM

## 2024-09-22 MED ORDER — MONTELUKAST SODIUM 10 MG PO TABS: 10.0000 mg | ORAL_TABLET | Freq: Every day | ORAL | 5 refills | Status: AC

## 2024-09-22 NOTE — Progress Notes (Signed)
 FOLLOW UP Date of Service/Encounter:   09/22/2024  Subjective:  Larry Brooks (DOB: [birth year]) is a 48 y.o. male who [facility] in re-evaluation of the following: Asthma, allergic rhinitis, recurrent sinusitis, food intolerances History obtained from: chart review and patient.  For Review, LV was on 05/23/24  with Dr.Nashaly Dorantes seen for routine follow-up. See below for summary of history and diagnostics.   Therapeutic plans/changes recommended: We discussed starting allergy  injections ----------------------------------------------------- Pertinent History/Diagnostics:  Asthma:  mild persistent, active during respiratory illness  - pre/post spirometry (12/22/21) while asymptomatic: ratio 100%, 75% FEV1 (pre), 75% FEV1 (post)-nonobstructive, no improvement with bronchodilator, scooping noted on expiratory loop Allergic Rhinitis:  - SPT environmental panel (12/22/21): positive to grasses, weeds, trees, indoor/outdoor molds, dust mites, cat, dogs, mixed feathers, cockroach, mouse  - SPT environmental (05/23/24): similar results to prior - AIT started 08/29/24-traditional build up; vial 1 contains M/DM/C/Cr and Vial 2: G/W/T/D Food Intolerance (almonds)  - Hx of reaction: -gets mouth sores if eats too many, so avoids large quantities - SPT select foods (almond): 2 x 2 (same as negative control)  - labs: igE almond < 0.10  Recurrent sinusitis:  Getting antibiotics 3-4 times per year-usually for sinus infection that goes to bronchitis, pneumonia twice - hospitalized in High School, No history of skin infection.  No recurrent ear infections.  No hospitalizations outside of high school.   Increased number of infections in the past 2-3 years (2020-2023)   - immune evaluation labs: CH50, IgM, IgA, IgG, CBCd, diphteria, tetanus and strep titers all within normal limits - no previous sinus imaging, last visit with ENT when he was a small  child --------------------------------------------------- Today presents for follow-up. History of Present Illness Larry Brooks is a 48 year old male with asthma who presents for follow-up on allergy  shots and asthma management.  Allergen immunotherapy - Undergoing allergen immunotherapy for approximately six weeks - Injections administered on Fridays to accommodate son's school schedule - Experiences stinging sensation with injections, especially when transitioning to stronger doses -Otherwise tolerating injections well, but too soon to tell if helping with allergies  Asthma symptoms and management - No recent use of Pulmicort  or albuterol  - Anticipates possible asthma exacerbation in upcoming months, typically around January or March - No need for ER, urgent care or systemic steroids since last visit  Sinus symptoms and allergic rhinitis - No recent sinus infections - Sinus symptoms occurred approximately four weeks ago - Used Zyrtec and increased vitamin C intake to 2000 mg daily during symptomatic period - Currently off Zyrtec and taking quercetin daily  Dietary modifications and physical activity - Adjusting diet to increase protein intake - Engaging in weightlifting to gain muscle and lose weight - Reintroduced almonds into diet without adverse reactions, despite prior contact dermatitis from nuts  Preventive health measures - Wife currently unwell - Taking precautions to maintain health, including continued vitamin C supplementation    Chart Review: Last injection 09/19/24-received blue vial 0.3 mL  All medications reviewed by clinical staff and updated in chart. No new pertinent medical or surgical history except as noted in HPI.  ROS: All others negative except as noted per HPI.   Objective:  BP 114/82 (BP Location: Right Arm, Patient Position: Sitting, Cuff Size: Normal)   Pulse 68   Temp 98.3 F (36.8 C) (Temporal)   Ht 5' 11 (1.803 m)   Wt 198 lb 4.8 oz  (89.9 kg)   SpO2 99%   BMI  27.66 kg/m  Body mass index is 27.66 kg/m. Physical Exam: General Appearance:  Alert, cooperative, no distress, appears stated age  Head:  Normocephalic, without obvious abnormality, atraumatic  Eyes:  Conjunctiva clear, EOM's intact  Ears EACs normal bilaterally and normal TMs bilaterally  Nose: Nares normal, hypertrophic turbinates, normal mucosa, and no visible anterior polyps  Throat: Lips, tongue normal; teeth and gums normal, normal posterior oropharynx  Neck: Supple, symmetrical  Lungs:   clear to auscultation bilaterally, Respirations unlabored, no coughing  Heart:  regular rate and rhythm and no murmur, Appears well perfused  Extremities: No edema  Skin: Skin color, texture, turgor normal and no rashes or lesions on visualized portions of skin  Neurologic: No gross deficits   Labs:  Lab Orders  No laboratory test(s) ordered today    Spirometry:  Tracings reviewed. His effort: Good reproducible efforts. FVC: 4.53L FEV1: 3.49L, 84% predicted FEV1/FVC ratio: 0.77 Interpretation: Spirometry consistent with normal pattern.  Please see scanned spirometry results for details.  Assessment/Plan   Seasonal and perennial allergic rhinitis: at goal on AIT - allergen avoidance towards grasses, weeds, trees, indoor/outdoor molds, dust mites, cat, dogs, mixed feathers, cockroach, mouse -05/23/24: similar results to prior; allergy  Rx written based on both tests. - continue allergy  injections per protocol, continue to wait 30 minutes and bring your epinephrine  autoinjector to clinic on days of injections - Continue Flonase  1-2 sprays daily as needed. - Continue over the counter antihistamine daily or daily as needed.   -Your options include Zyrtec (Cetirizine) 10mg , Claritin (Loratadine) 10mg , Allegra (Fexofenadine) 180mg , or Xyzal (Levocetirinze) 5mg  -continue singulair  10 mg nightly-stop if nightmares or behavior changes.  Recurrent sinusitis: at goal,  none since last visit - immune labs were reassuring, no further immune evaluation indicated - continue to keep track of sinus infections and how often you are needing antibiotics, may consider sinus imaging in the future if this continues to be an issue  Mild Persistent Asthma: at goal Spirometry today: looked great - During respiratory illness/asthma flares:  Continue Pulmicort  90 mcg, 2 puffs twice a day; Use for at least 1-2 week or until symptoms resolve. - Rinse mouth out after use - Rescue Inhaler: Albuterol  (Proair /Ventolin ) 2 puffs . Use  every 4-6 hours as needed for chest tightness, wheezing, or coughing.  Can also use 15 minutes prior to exercise if you have symptoms with activity. - Asthma is not controlled if:  - Symptoms are occurring >2 times a week OR  - >2 times a month nighttime awakenings  - You are requiring systemic steroids (prednisone /steroid injections) more than once per year  - Your require hospitalization for your asthma.  - Please call the clinic to schedule a follow up if these symptoms arise  Almond Intolerance-resolved, eating almonds and tolerating - avoidance is optional based on tolerance of symptoms  Follow-up in 6 months, sooner if needed It was a pleasure seeing you again in clinic today!  Other: none  Rocky Endow, MD  [facility] 

## 2024-09-22 NOTE — Patient Instructions (Addendum)
 Seasonal and perennial allergic rhinitis: - allergen avoidance towards grasses, weeds, trees, indoor/outdoor molds, dust mites, cat, dogs, mixed feathers, cockroach, mouse -05/23/24: similar results to prior; allergy  Rx written based on both tests. - continue allergy  injections per protocol, continue to wait 30 minutes and bring your epinephrine  autoinjector to clinic on days of injections - Continue Flonase  1-2 sprays daily as needed. - Continue over the counter antihistamine daily or daily as needed.   -Your options include Zyrtec (Cetirizine) 10mg , Claritin (Loratadine) 10mg , Allegra (Fexofenadine) 180mg , or Xyzal (Levocetirinze) 5mg  -continue singulair  10 mg nightly-stop if nightmares or behavior changes.  Recurrent sinusitis:  - immune labs were reassuring, no further immune evaluation indicated - continue to keep track of sinus infections and how often you are needing antibiotics, may consider sinus imaging in the future if this continues to be an issue  Mild Persistent Asthma: Spirometry today: looked great - During respiratory illness/asthma flares:  Continue Pulmicort  90 mcg, 2 puffs twice a day; Use for at least 1-2 week or until symptoms resolve. - Rinse mouth out after use - Rescue Inhaler: Albuterol  (Proair /Ventolin ) 2 puffs . Use  every 4-6 hours as needed for chest tightness, wheezing, or coughing.  Can also use 15 minutes prior to exercise if you have symptoms with activity. - Asthma is not controlled if:  - Symptoms are occurring >2 times a week OR  - >2 times a month nighttime awakenings  - You are requiring systemic steroids (prednisone /steroid injections) more than once per year  - Your require hospitalization for your asthma.  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  Rocky Endow, MD [facility]  Reducing Pollen Exposure  The American Academy of All[employer] , Asthma and Immun[employer] suggests the following steps to reduce your exposure to pollen during allergy  seasons.    Do not hang sheets or clothing out to dry; pollen may collect on these items. Do not mow lawns or spend time around freshly cut grass; mowing stirs up pollen. Keep windows closed at night.  Keep car windows closed while driving. Minimize morning activities outdoors, a time when pollen counts are usually at their highest. Stay indoors as much as possible when pollen counts or humidity is high and on windy days when pollen tends to remain in the air longer. Use air conditioning when possible.  Many air conditioners have filters that trap the pollen spores. Use a HEPA room air filter to remove pollen form the indoor air you breathe.  DUST MITE AVOIDANCE MEASURES:  There are three main measures that need and can be taken to avoid house dust mites:  Reduce accumulation of dust in general -reduce furniture, clothing, carpeting, books, stuffed animals, especially in bedroom  Separate yourself from the dust -use pillow and mattress encasements (can be found at stores such as [employer] or online) -avoid direct exposure to air condition flow -use a HEPA filter device, especially in the bedroom; you can also use a HEPA filter vacuum cleaner -wipe dust with a moist towel instead of a dry towel or broom when cleaning  Decrease mites and/or their secretions -wash clothing and linen and stuffed animals at highest temperature possible, at least every 2 weeks -stuffed animals can also be placed in a bag and  put in a freezer overnight  Despite the above measures, it is impossible to eliminate dust mites or their allergen completely from your home.  With the above measures the burden of mites in your home can be diminished, with the goal of minimizing your allergic  symptoms.  Success will be reached only when implementing and using all means together.  Control of Dog or Cat Allergen  Avoidance is the best way to manage a dog or cat allergy . If you have a dog or cat and are allergic to dog or cats, consider removing the dog or cat from the home. If you have a dog or cat but don't want to find it a new home, or if your family wants a pet even though someone in the household is allergic, here are some strategies that may help keep symptoms at bay:  Keep the pet out of your bedroom and restrict it to only a few rooms. Be advised that keeping the dog or cat in only one room will not limit the allergens to that room. Don't pet, hug or kiss the dog or cat; if you do, wash your hands with soap and water. High-efficiency particulate air (HEPA) cleaners run continuously in a bedroom or living room can reduce allergen levels over time. Regular use of a high-efficiency vacuum cleaner or a central vacuum can reduce allergen levels. Giving your dog or cat a bath at least once a week can reduce airborne allergen.  Control of Cockroach Allergen  Cockroach allergen has been identified as an important cause of acute attacks of asthma, especially in urban settings.  There are fifty-five species of cockroach that exist in the [place] , however only three, the American, German and Oriental species produce allergen that can affect patients with Asthma.  Allergens can be obtained from fecal particles, egg casings and secretions from cockroaches.    Remove food sources. Reduce access to water. Seal access and entry points. Spray runways with 0.5-1% Diazinon or Chlorpyrifos Blow boric acid power under stoves and refrigerator. Place bait stations (hydramethylnon) at feeding sites.

## 2024-09-26 ENCOUNTER — Ambulatory Visit (INDEPENDENT_AMBULATORY_CARE_PROVIDER_SITE_OTHER): Admitting: *Deleted

## 2024-09-26 DIAGNOSIS — J309 Allergic rhinitis, unspecified: Secondary | ICD-10-CM | POA: Diagnosis not present

## 2024-10-02 ENCOUNTER — Ambulatory Visit (INDEPENDENT_AMBULATORY_CARE_PROVIDER_SITE_OTHER): Admitting: Internal Medicine

## 2024-10-02 DIAGNOSIS — J309 Allergic rhinitis, unspecified: Secondary | ICD-10-CM

## 2024-10-10 ENCOUNTER — Ambulatory Visit (INDEPENDENT_AMBULATORY_CARE_PROVIDER_SITE_OTHER)

## 2024-10-10 DIAGNOSIS — J309 Allergic rhinitis, unspecified: Secondary | ICD-10-CM

## 2024-10-17 ENCOUNTER — Ambulatory Visit (INDEPENDENT_AMBULATORY_CARE_PROVIDER_SITE_OTHER): Payer: Self-pay

## 2024-10-17 DIAGNOSIS — J309 Allergic rhinitis, unspecified: Secondary | ICD-10-CM

## 2024-10-23 ENCOUNTER — Encounter: Payer: Self-pay | Admitting: Internal Medicine

## 2024-10-27 ENCOUNTER — Ambulatory Visit

## 2024-10-27 DIAGNOSIS — J309 Allergic rhinitis, unspecified: Secondary | ICD-10-CM | POA: Diagnosis not present

## 2024-11-07 ENCOUNTER — Ambulatory Visit

## 2024-11-07 DIAGNOSIS — J309 Allergic rhinitis, unspecified: Secondary | ICD-10-CM

## 2024-11-14 ENCOUNTER — Ambulatory Visit (INDEPENDENT_AMBULATORY_CARE_PROVIDER_SITE_OTHER)

## 2024-11-14 DIAGNOSIS — J302 Other seasonal allergic rhinitis: Secondary | ICD-10-CM | POA: Diagnosis not present

## 2024-11-14 DIAGNOSIS — J3089 Other allergic rhinitis: Secondary | ICD-10-CM

## 2024-11-25 ENCOUNTER — Ambulatory Visit (INDEPENDENT_AMBULATORY_CARE_PROVIDER_SITE_OTHER)

## 2024-11-25 DIAGNOSIS — J3089 Other allergic rhinitis: Secondary | ICD-10-CM | POA: Diagnosis not present

## 2024-11-25 DIAGNOSIS — J302 Other seasonal allergic rhinitis: Secondary | ICD-10-CM

## 2024-12-17 ENCOUNTER — Encounter: Payer: Self-pay | Admitting: Internal Medicine

## 2024-12-18 ENCOUNTER — Ambulatory Visit

## 2024-12-18 DIAGNOSIS — J302 Other seasonal allergic rhinitis: Secondary | ICD-10-CM

## 2024-12-30 ENCOUNTER — Ambulatory Visit

## 2024-12-30 DIAGNOSIS — J302 Other seasonal allergic rhinitis: Secondary | ICD-10-CM | POA: Diagnosis not present

## 2025-03-25 ENCOUNTER — Ambulatory Visit: Admitting: Internal Medicine
